# Patient Record
Sex: Female | Born: 1995 | Race: Black or African American | Hispanic: No | Marital: Single | Smoking: Never smoker
Health system: Southern US, Community
[De-identification: ages and names within clinical notes are randomized; demographics above are authoritative.]

## PROBLEM LIST (undated history)

## (undated) DIAGNOSIS — F32A Depression, unspecified: Secondary | ICD-10-CM

## (undated) DIAGNOSIS — I10 Essential (primary) hypertension: Secondary | ICD-10-CM

## (undated) DIAGNOSIS — T8859XA Other complications of anesthesia, initial encounter: Secondary | ICD-10-CM

## (undated) DIAGNOSIS — F419 Anxiety disorder, unspecified: Secondary | ICD-10-CM

## (undated) HISTORY — DX: Depression, unspecified: F32.A

## (undated) HISTORY — DX: Anxiety disorder, unspecified: F41.9

## (undated) HISTORY — DX: Other complications of anesthesia, initial encounter: T88.59XA

## (undated) HISTORY — PX: WISDOM TOOTH EXTRACTION: SHX21

## (undated) HISTORY — DX: Essential (primary) hypertension: I10

---
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
## 2016-06-16 ENCOUNTER — Emergency Department (HOSPITAL_COMMUNITY)
Admission: EM | Admit: 2016-06-16 | Discharge: 2016-06-16 | Disposition: A | Discharge status: Home / Self Care | Payer: BLUE CROSS/BLUE SHIELD | Attending: Emergency Medicine | Admitting: Emergency Medicine

## 2016-06-16 ENCOUNTER — Encounter (HOSPITAL_COMMUNITY): Payer: Self-pay | Admitting: Emergency Medicine

## 2016-06-16 ENCOUNTER — Emergency Department (HOSPITAL_COMMUNITY): Payer: BLUE CROSS/BLUE SHIELD

## 2016-06-16 DIAGNOSIS — R112 Nausea with vomiting, unspecified: Secondary | ICD-10-CM | POA: Diagnosis not present

## 2016-06-16 DIAGNOSIS — R059 Cough, unspecified: Secondary | ICD-10-CM

## 2016-06-16 DIAGNOSIS — Z79899 Other long term (current) drug therapy: Secondary | ICD-10-CM | POA: Insufficient documentation

## 2016-06-16 DIAGNOSIS — B349 Viral infection, unspecified: Secondary | ICD-10-CM

## 2016-06-16 DIAGNOSIS — R05 Cough: Secondary | ICD-10-CM | POA: Diagnosis present

## 2016-06-16 MED ORDER — ACETAMINOPHEN 325 MG PO TABS
650.0000 mg | ORAL_TABLET | Freq: Once | ORAL | Status: AC
  Administered 2016-06-16: 650 mg via ORAL
  Filled 2016-06-16: qty 2

## 2016-06-16 MED ORDER — ONDANSETRON 4 MG PO TBDP: 4.0000 mg | ORAL_TABLET | Freq: Three times a day (TID) | ORAL | 0 refills | Status: DC | PRN

## 2016-06-16 MED ORDER — SODIUM CHLORIDE 0.9 % IV BOLUS (SEPSIS)
1000.0000 mL | Freq: Once | INTRAVENOUS | Status: AC
  Administered 2016-06-16: 1000 mL via INTRAVENOUS

## 2016-06-16 MED ORDER — ONDANSETRON HCL 4 MG/2ML IJ SOLN
4.0000 mg | Freq: Once | INTRAMUSCULAR | Status: AC
  Administered 2016-06-16: 4 mg via INTRAVENOUS
  Filled 2016-06-16: qty 2

## 2016-06-16 NOTE — ED Notes (Signed)
Patient transported to X-ray 

## 2016-06-16 NOTE — ED Provider Notes (Signed)
[facility]-EMERGENCY DEPT Provider Note   CSN: [id number] Arrival date & time: 06/16/16  4098     History   Chief Complaint Chief Complaint  Patient presents with  . Nausea    HPI Sara Jensen is a 21 y.o. female.  The history is provided by the patient and medical records. No language interpreter was used.   Sara Jensen is an otherwise healthy 21 y.o. female who presents to the Emergency Department complaining of nausea, vomiting and productive cough since yesterday. She reports > 10 episodes of emesis. She took Tylenol PM last night with little relief. No alleviating or aggravating factors noted. Patient denies fever, however temp of 102.7 in triage. Denies abdominal pain, dysuria, back pain, vaginal discharge, diarrhea, blood in the stool, chest pain, shortness of breath.    History reviewed. No pertinent past medical history.  There are no active problems to display for this patient.   History reviewed. No pertinent surgical history.  OB History    No data available       Home Medications    Prior to Admission medications   Medication Sig Start Date End Date Taking? Authorizing Provider  diphenhydrAMINE (BENADRYL) 25 mg capsule Take 1 capsule (25 mg total) by mouth every 6 (six) hours as needed for itching or allergies. 02/10/15   Earley Favor, NP  ondansetron (ZOFRAN ODT) 4 MG disintegrating tablet Take 1 tablet (4 mg total) by mouth every 8 (eight) hours as needed for nausea or vomiting. 06/16/16   Chase Picket Marea Reasner, PA-C    Family History No family history on file.  Social History Social History  Substance Use Topics  . Smoking status: Never Smoker  . Smokeless tobacco: Never Used  . Alcohol use No     Allergies   Patient has no known allergies.   Review of Systems Review of Systems  Constitutional: Positive for fever. Negative for chills.  HENT: Positive for congestion.   Eyes: Negative for visual disturbance.  Respiratory: Positive for cough.  Negative for shortness of breath and wheezing.   Cardiovascular: Negative for chest pain.  Gastrointestinal: Positive for nausea and vomiting. Negative for abdominal pain, blood in stool, constipation and diarrhea.  Genitourinary: Negative for dysuria and vaginal discharge.  Musculoskeletal: Negative for back pain and neck pain.  Skin: Negative for rash.  Neurological: Negative for headaches.     Physical Exam Updated Vital Signs BP 116/85   Pulse 103   Temp 102.7 F (39.3 C) (Oral)   Resp 15   Ht 5\' 3"  (1.6 m)   Wt 65.8 kg   LMP 06/11/2016 (Exact Date)   SpO2 100%   BMI 25.69 kg/m   Physical Exam  Constitutional: She is oriented to person, place, and time. She appears well-developed and well-nourished. No distress.  Nontoxic appearing.  HENT:  Head: Normocephalic and atraumatic.  Cardiovascular: Normal heart sounds.   No murmur heard. Tachycardic but regular.  Pulmonary/Chest: Effort normal and breath sounds normal. No respiratory distress. She has no wheezes. She has no rales. She exhibits no tenderness.  Abdominal: Soft. She exhibits no distension. There is no tenderness.  Generalized tenderness to palpation. No focal area of tenderness at McBurney's point. No rebound or guarding.  No CVA tenderness.  Musculoskeletal: Normal range of motion.  Neurological: She is alert and oriented to person, place, and time.  Skin: Skin is warm and dry.  Nursing note and vitals reviewed.    ED Treatments / Results  Labs (all labs ordered are listed,  but only abnormal results are displayed) Labs Reviewed - No data to display  EKG  EKG Interpretation None       Radiology Dg Chest 2 View  Result Date: 06/16/2016 CLINICAL DATA:  Productive cough, fever, multiple episodes of vomiting today. EXAM: CHEST  2 VIEW COMPARISON:  None. FINDINGS: Cardiomediastinal silhouette is normal. No pleural effusions or focal consolidations. Trachea projects midline and there is no pneumothorax.  Soft tissue planes and included osseous structures are non-suspicious. IMPRESSION: Normal chest. Electronically Signed   By: Awilda Metroourtnay  Bloomer M.D.   On: 06/16/2016 19:06    Procedures Procedures (including critical care time)  Medications Ordered in ED Medications  sodium chloride 0.9 % bolus 1,000 mL (1,000 mLs Intravenous New Bag/Given 06/16/16 1911)  ondansetron (ZOFRAN) injection 4 mg (4 mg Intravenous Given 06/16/16 1909)  acetaminophen (TYLENOL) tablet 650 mg (650 mg Oral Given 06/16/16 1937)     Initial Impression / Assessment and Plan / ED Course  I have reviewed the triage vital signs and the nursing notes.  Pertinent labs & imaging results that were available during my care of the patient were reviewed by me and considered in my medical decision making (see chart for details).  Clinical Course    Sara Jensen is a 21 y.o. female who presents to ED for n/v, cough and congestion since yesterday. On exam, patient is febrile 102.7, tachycardic 120's. Abdomen with generalized tenderness but no peritoneal signs or CVA tenderness. CXR negative. Likely viral illness. Fluids, zofran and tylenol given.   Patient reevaluated and feels much improved after fluids/medications. Tolerating PO with no emesis while in ED. PCP follow up encouraged. Return precautions discussed. Symptomatic home care instructions discussed. Rx for zofran given. All questions answered.   Patient discussed with Dr. Clydene PughKnott who agrees with treatment plan.   Final Clinical Impressions(s) / ED Diagnoses   Final diagnoses:  Cough  Viral illness    New Prescriptions New Prescriptions   ONDANSETRON (ZOFRAN ODT) 4 MG DISINTEGRATING TABLET    Take 1 tablet (4 mg total) by mouth every 8 (eight) hours as needed for nausea or vomiting.     [facility] HospitalJaime Pilcher Celso Granja, PA-C 06/16/16 2001    Lyndal Pulleyaniel Knott, MD 06/16/16 2029

## 2016-06-16 NOTE — ED Triage Notes (Signed)
Pt has N/V has thrown up 12 times today, pt started feeling ill yesterday. Denies diarrhea

## 2016-06-16 NOTE — ED Notes (Signed)
Pt verbalized understanding discharge instructions and will follow up with PCP

## 2016-06-16 NOTE — Discharge Instructions (Signed)
Follow up with your primary care doctor about your hospital visit. Continue to hydrate orally with small sips of fluids throughout the day. Use Zofran as directed for nausea & vomiting. Alternate between Tylenol and ibuprofen as needed for fever.   The 'BRAT' diet is suggested, then progress to diet as tolerated as symptoms abate.  Bananas.  Rice.  Applesauce.  Toast (and other simple starches such as crackers, potatoes, noodles).   SEEK IMMEDIATE MEDICAL ATTENTION IF: You begin having localized abdominal pain that does not go away or becomes severe Fever does not improve with tylenol/ibuprofen  Repeated vomiting occurs (multiple uncontrollable episodes) or you are unable to keep fluids down Blood is being passed in stools or vomit (bright red or black tarry stools).  If you develop chest pain, difficulty breathing, dizziness or fainting, or become confused, poorly responsive, or inconsolable (young children).

## 2019-11-13 ENCOUNTER — Ambulatory Visit (INDEPENDENT_AMBULATORY_CARE_PROVIDER_SITE_OTHER): Payer: 59 | Admitting: *Deleted

## 2019-11-13 ENCOUNTER — Other Ambulatory Visit: Payer: Self-pay

## 2019-11-13 ENCOUNTER — Encounter: Payer: Self-pay | Admitting: General Practice

## 2019-11-13 VITALS — BP 136/82 | HR 89 | Temp 98.3°F | Ht 63.0 in | Wt 136.8 lb

## 2019-11-13 DIAGNOSIS — Z3200 Encounter for pregnancy test, result unknown: Secondary | ICD-10-CM

## 2019-11-13 DIAGNOSIS — Z3201 Encounter for pregnancy test, result positive: Secondary | ICD-10-CM

## 2019-11-13 LAB — POCT URINE PREGNANCY: Preg Test, Ur: POSITIVE — AB

## 2019-11-13 NOTE — Progress Notes (Signed)
   Ms. Yearick presents today for UPT.  She complains of nausea for 2 weeks with vomiting for 1 day.  LMP: 09/23/2019    OBJECTIVE: Appears well, in no apparent distress.  OB History    Gravida  1   Para      Term      Preterm      AB      Living        SAB      TAB      Ectopic      Multiple      Live Births             Home UPT Result: Positive In-Office UPT result: Positive   I have reviewed the patient's medical, obstetrical, social, and family histories, and medications.   ASSESSMENT: Positive pregnancy test  PLAN Prenatal care to be completed at: [facility] Continue PNV Declined medication for nausea.  Clovis Pu, RN

## 2019-11-13 NOTE — Patient Instructions (Addendum)
First Trimester of Pregnancy  The first trimester of pregnancy is from week 1 until the end of week 13 (months 1 through 3). During this time, your baby will begin to develop inside you. At 6-8 weeks, the eyes and face are formed, and the heartbeat can be seen on ultrasound. At the end of 12 weeks, all the baby's organs are formed. Prenatal care is all the medical care you receive before the birth of your baby. Make sure you get good prenatal care and follow all of your doctor's instructions. Follow these instructions at home: Medicines  Take over-the-counter and prescription medicines only as told by your doctor. Some medicines are safe and some medicines are not safe during pregnancy.  Take a prenatal vitamin that contains at least 600 micrograms (mcg) of folic acid.  If you have trouble pooping (constipation), take medicine that will make your stool soft (stool softener) if your doctor approves. Eating and drinking   Eat regular, healthy meals.  Your doctor will tell you the amount of weight gain that is right for you.  Avoid raw meat and uncooked cheese.  If you feel sick to your stomach (nauseous) or throw up (vomit): ? Eat 4 or 5 small meals a day instead of 3 large meals. ? Try eating a few soda crackers. ? Drink liquids between meals instead of during meals.  To prevent constipation: ? Eat foods that are high in fiber, like fresh fruits and vegetables, whole grains, and beans. ? Drink enough fluids to keep your pee (urine) clear or pale yellow. Activity  Exercise only as told by your doctor. Stop exercising if you have cramps or pain in your lower belly (abdomen) or low back.  Do not exercise if it is too hot, too humid, or if you are in a place of great height (high altitude).  Try to avoid standing for long periods of time. Move your legs often if you must stand in one place for a long time.  Avoid heavy lifting.  Wear low-heeled shoes. Sit and stand up  straight.  You can have sex unless your doctor tells you not to. Relieving pain and discomfort  Wear a good support bra if your breasts are sore.  Take warm water baths (sitz baths) to soothe pain or discomfort caused by hemorrhoids. Use hemorrhoid cream if your doctor says it is okay.  Rest with your legs raised if you have leg cramps or low back pain.  If you have puffy, bulging veins (varicose veins) in your legs: ? Wear support hose or compression stockings as told by your doctor. ? Raise (elevate) your feet for 15 minutes, 3-4 times a day. ? Limit salt in your food. Prenatal care  Schedule your prenatal visits by the twelfth week of pregnancy.  Write down your questions. Take them to your prenatal visits.  Keep all your prenatal visits as told by your doctor. This is important. Safety  Wear your seat belt at all times when driving.  Make a list of emergency phone numbers. The list should include numbers for family, friends, the hospital, and police and fire departments. General instructions  Ask your doctor for a referral to a local prenatal class. Begin classes no later than at the start of month 6 of your pregnancy.  Ask for help if you need counseling or if you need help with nutrition. Your doctor can give you advice or tell you where to go for help.  Do not use hot tubs, steam   tubs, steam rooms, or saunas.  Do not douche or use tampons or scented sanitary pads.  Do not cross your legs for long periods of time.  Avoid all herbs and alcohol. Avoid drugs that are not approved by your doctor.  Do not use any tobacco products, including cigarettes, chewing tobacco, and electronic cigarettes. If you need help quitting, ask your doctor. You may get counseling or other support to help you quit.  Avoid cat litter boxes and soil used by cats. These carry germs that can cause birth defects in the baby and can cause a loss of your baby (miscarriage) or stillbirth.  Visit your dentist.  At home, brush your teeth with a soft toothbrush. Be gentle when you floss. Contact a doctor if:  You are dizzy.  You have mild cramps or pressure in your lower belly.  You have a nagging pain in your belly area.  You continue to feel sick to your stomach, you throw up, or you have watery poop (diarrhea).  You have a bad smelling fluid coming from your vagina.  You have pain when you pee (urinate).  You have increased puffiness (swelling) in your face, hands, legs, or ankles. Get help right away if:  You have a fever.  You are leaking fluid from your vagina.  You have spotting or bleeding from your vagina.  You have very bad belly cramping or pain.  You gain or lose weight rapidly.  You throw up blood. It may look like coffee grounds.  You are around people who have [place] measles, fifth disease, or chickenpox.  You have a very bad headache.  You have shortness of breath.  You have any kind of trauma, such as from a fall or a car accident. Summary  The first trimester of pregnancy is from week 1 until the end of week 13 (months 1 through 3).  To take care of yourself and your unborn baby, you will need to eat healthy meals, take medicines only if your doctor tells you to do so, and do activities that are safe for you and your baby.  Keep all follow-up visits as told by your doctor. This is important as your doctor will have to ensure that your baby is healthy and growing well. This information is not intended to replace advice given to you by your health care provider. Make sure you discuss any questions you have with your health care provider. Document Revised: 09/07/2018 Document Reviewed: 05/25/2016 Elsevier Patient Education  2020 [employer].  Warning Signs During Pregnancy A pregnancy lasts about 40 weeks, starting from the first day of your last period until the baby is born. Pregnancy is divided into three phases called trimesters.  The first trimester  refers to week 1 through week 13 of pregnancy.  The second trimester is the start of week 14 through the end of week 27.  The third trimester is the start of week 28 until you deliver your baby. During each trimester of pregnancy, certain signs and symptoms may indicate a problem. Talk with your health care provider about your current health and any medical conditions you have. Make sure you know the symptoms that you should watch for and report. How does this affect me?  Warning signs in the first trimester While some changes during the first trimester may be uncomfortable, most do not represent a serious problem. Let your health care provider know if you have any of the following warning signs in the first trimester:  You cannot eat or drink without vomiting, and this lasts for longer than a day.  You have vaginal bleeding or spotting along with menstrual-like cramping.  You have diarrhea for longer than a day.  You have a fever or other signs of infection, such as: ? Pain or burning when you urinate. ? Foul smelling or thick or yellowish vaginal discharge. Warning signs in the second trimester As your baby grows and changes during the second trimester, there are additional signs and symptoms that may indicate a problem. These include:  Signs and symptoms of infection, including a fever.  Signs or symptoms of a miscarriage or preterm labor, such as regular contractions, menstrual-like cramping, or lower abdominal pain.  Bloody or watery vaginal discharge or obvious vaginal bleeding.  Feeling like your heart is pounding.  Having trouble breathing.  Nausea, vomiting, or diarrhea that lasts for longer than a day.  Craving non-food items, such as clay, chalk, or dirt. This may be a sign of a very treatable medical condition called pica. Later in your second trimester, watch for signs and symptoms of a serious medical condition called preeclampsia.These include:  Changes in your  vision.  A severe headache that does not go away.  Nausea and vomiting. It is also important to notice if your baby stops moving or moves less than usual during this time. Warning signs in the third trimester As you approach the third trimester, your baby is growing and your body is preparing for the birth of your baby. In your third trimester, be sure to let your health care provider know if:  You have signs and symptoms of infection, including a fever.  You have vaginal bleeding.  You notice that your baby is moving less than usual or is not moving.  You have nausea, vomiting, or diarrhea that lasts for longer than a day.  You have a severe headache that does not go away.  You have vision changes, including seeing spots or having blurry or double vision.  You have increased swelling in your hands or face. How does this affect my baby? Throughout your pregnancy, always report any of the warning signs of a problem to your health care provider. This can help prevent complications that may affect your baby, including:  Increased risk for premature birth.  Infection that may be transmitted to your baby.  Increased risk for stillbirth. Contact a health care provider if:  You have any of the warning signs of a problem for the current trimester of your pregnancy.  Any of the following apply to you during any trimester of pregnancy: ? You have strong emotions, such as sadness or anxiety, that interfere with work or personal relationships. ? You feel unsafe in your home and need help finding a safe place to live. ? You are using tobacco products, alcohol, or drugs and you need help to stop. Get help right away if: You have signs or symptoms of labor before 37 weeks of pregnancy. These include:  Contractions that are 5 minutes or less apart, or that increase in frequency, intensity, or length.  Sudden, sharp abdominal pain or low back pain.  Uncontrolled gush or trickle of fluid  from your vagina. Summary  A pregnancy lasts about 40 weeks, starting from the first day of your last period until the baby is born. Pregnancy is divided into three phases called trimesters. Each trimester has warning signs to watch for.  Always report any warning signs to your health care provider in  order to prevent complications that may affect both you and your baby.  Talk with your health care provider about your current health and any medical conditions you have. Make sure you know the symptoms that you should watch for and report. This information is not intended to replace advice given to you by your health care provider. Make sure you discuss any questions you have with your health care provider. Document Revised: 09/05/2018 Document Reviewed: 03/03/2017 Elsevier Patient Education  2020 [employer]  

## 2019-11-28 ENCOUNTER — Telehealth (INDEPENDENT_AMBULATORY_CARE_PROVIDER_SITE_OTHER): Payer: 59 | Admitting: *Deleted

## 2019-11-28 ENCOUNTER — Other Ambulatory Visit: Payer: Self-pay

## 2019-11-28 DIAGNOSIS — Z3490 Encounter for supervision of normal pregnancy, unspecified, unspecified trimester: Secondary | ICD-10-CM

## 2019-11-28 DIAGNOSIS — F419 Anxiety disorder, unspecified: Secondary | ICD-10-CM

## 2019-11-28 DIAGNOSIS — F32A Depression, unspecified: Secondary | ICD-10-CM

## 2019-11-28 DIAGNOSIS — I10 Essential (primary) hypertension: Secondary | ICD-10-CM | POA: Insufficient documentation

## 2019-11-28 DIAGNOSIS — F329 Major depressive disorder, single episode, unspecified: Secondary | ICD-10-CM

## 2019-11-28 NOTE — Progress Notes (Signed)
2:12pm I called Taje for her new ob intake visit and asked if she can connect with me virtually . She confirmed she would. We  Disconnected  And then reconnected virtually.  I connected with  [name] on 11/28/19 at  2:15 PM EDT by virtually and verified that I am speaking with the correct person using two identifiers.   I discussed the limitations, risks, security and privacy concerns of performing an evaluation and management service by virtually and the availability of in person appointments. I also discussed with the patient that there may be a patient responsible charge related to this service. The patient expressed understanding and agreed to proceed  I explained I am completing her New OB Intake today. We discussed Her EDD and that  We can not confirm her EDD. She reports she is not sure if her LMP was 08/28/19 or 09/23/19. States she has a period app and the last one she documented is 08/28/19 and is not sure if she has a period on 09/23/19.  I explained our protocol is if you are unsure of LMP to do a dating [place]. She agreed with plan and I scheduled a dating [place] for 12/05/19.   I reviewed her allergies, meds, OB History, Medical /Surgical history, and appropriate screenings. I informed her of [facility].  She is a G1P0 . 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 States she has been watching her diet . States at [facility] her blood pressure was normal.   I explained  We will watch her blood pressure closely during her pregnancy and we will have her check her blood pressure weekly at home if possible.  I explained I will send her the [employer] app so that she can log in her blood pressures and for all the wonderful information in the app. App was sent to her while on phone.   I explained her insurance does not cover blood pressure cuff prescription. I asked if she can buy one and she confirmed she can .  I asked her to bring the  blood pressure cuff with her to her first ob appointment so we can show her how to use it. Explained  then we will have her take her blood pressure weekly and enter into the app.  I explained she will have some visits in office and some virtually. She already has MyChart  She used on the computer and will download the app.  I reviewed her new ob  appointment date/ time with her , our location and to wear [employer].   I explained she will have a pelvic exam, ob bloodwork, hemoglobin a1C, cbg ,pap, and  genetic testing if desired,- she does want a panorama.  I explained we will schedule an [place] at 19 weeks  Once we know her actual EDD.  She voices understanding.  Legrand Como 11/28/2019  2:15 PM

## 2019-11-28 NOTE — Patient Instructions (Signed)

## 2019-11-29 NOTE — Progress Notes (Signed)
Chart reviewed for nurse visit. Agree with plan of care. Plan to schedule [place] for dating prior to new OB visit.   Marny Lowenstein, PA-C 11/29/2019 1:30 PM

## 2019-12-05 ENCOUNTER — Ambulatory Visit
Admission: RE | Admit: 2019-12-05 | Discharge: 2019-12-05 | Disposition: A | Discharge status: Home / Self Care | Payer: 59 | Source: Ambulatory Visit | Attending: Medical | Admitting: Medical

## 2019-12-05 ENCOUNTER — Other Ambulatory Visit: Payer: Self-pay

## 2019-12-05 DIAGNOSIS — Z3490 Encounter for supervision of normal pregnancy, unspecified, unspecified trimester: Secondary | ICD-10-CM | POA: Insufficient documentation

## 2019-12-10 ENCOUNTER — Encounter: Payer: Self-pay | Admitting: Medical

## 2019-12-10 ENCOUNTER — Other Ambulatory Visit: Payer: Self-pay

## 2019-12-10 ENCOUNTER — Other Ambulatory Visit (HOSPITAL_COMMUNITY)
Admission: RE | Admit: 2019-12-10 | Discharge: 2019-12-10 | Disposition: A | Discharge status: Home / Self Care | Payer: 59 | Source: Ambulatory Visit | Attending: Medical | Admitting: Medical

## 2019-12-10 ENCOUNTER — Ambulatory Visit (INDEPENDENT_AMBULATORY_CARE_PROVIDER_SITE_OTHER): Payer: 59 | Admitting: Medical

## 2019-12-10 VITALS — BP 127/82 | HR 86 | Wt 141.7 lb

## 2019-12-10 DIAGNOSIS — Z3491 Encounter for supervision of normal pregnancy, unspecified, first trimester: Secondary | ICD-10-CM | POA: Insufficient documentation

## 2019-12-10 DIAGNOSIS — Z3A19 19 weeks gestation of pregnancy: Secondary | ICD-10-CM

## 2019-12-10 DIAGNOSIS — Z3492 Encounter for supervision of normal pregnancy, unspecified, second trimester: Secondary | ICD-10-CM

## 2019-12-10 DIAGNOSIS — Z3689 Encounter for other specified antenatal screening: Secondary | ICD-10-CM

## 2019-12-10 DIAGNOSIS — O99342 Other mental disorders complicating pregnancy, second trimester: Secondary | ICD-10-CM | POA: Diagnosis not present

## 2019-12-10 DIAGNOSIS — F419 Anxiety disorder, unspecified: Secondary | ICD-10-CM | POA: Diagnosis not present

## 2019-12-10 DIAGNOSIS — F329 Major depressive disorder, single episode, unspecified: Secondary | ICD-10-CM | POA: Diagnosis not present

## 2019-12-10 LAB — POCT URINALYSIS DIP (DEVICE)
Bilirubin Urine: NEGATIVE
Glucose, UA: NEGATIVE mg/dL
Hgb urine dipstick: NEGATIVE
Ketones, ur: NEGATIVE mg/dL
Leukocytes,Ua: NEGATIVE
Nitrite: NEGATIVE
Protein, ur: NEGATIVE mg/dL
Specific Gravity, Urine: 1.025 (ref 1.005–1.030)
Urobilinogen, UA: 0.2 mg/dL (ref 0.0–1.0)
pH: 7 (ref 5.0–8.0)

## 2019-12-10 NOTE — Patient Instructions (Addendum)
Safe Medications in Pregnancy   Acne:  Benzoyl Peroxide  Salicylic Acid   Backache/Headache:  Tylenol: 2 regular strength every 4 hours OR        2 Extra strength every 6 hours   Colds/Coughs/Allergies:  Benadryl (alcohol free) 25 mg every 6 hours as needed  Breath right strips  Claritin  Cepacol throat lozenges  Chloraseptic throat spray  Cold-Eeze- up to three times per day  Cough drops, alcohol free  Flonase (by prescription only)  Guaifenesin  Mucinex  Robitussin DM (plain only, alcohol free)  Saline nasal spray/drops  Sudafed (pseudoephedrine) & Actifed * use only after [redacted] weeks gestation and if you do not have high blood pressure  Tylenol  Vicks Vaporub  Zinc lozenges  Zyrtec   Constipation:  Colace  Ducolax suppositories  Fleet enema  Glycerin suppositories  Metamucil  Milk of magnesia  Miralax  Senokot  Smooth move tea   Diarrhea:  Kaopectate  Imodium A-D   *NO pepto Bismol   Hemorrhoids:  Anusol  Anusol HC  Preparation H  Tucks   Indigestion:  Tums  Maalox  Mylanta  Zantac  Pepcid   Insomnia:  Benadryl (alcohol free) 25mg  every 6 hours as needed  Tylenol PM  Unisom, no Gelcaps   Leg Cramps:  Tums  MagGel   Nausea/Vomiting:  Bonine  Dramamine  Emetrol  Ginger extract  Sea bands  Meclizine  Nausea medication to take during pregnancy:  Unisom (doxylamine succinate 25 mg tablets) Take one tablet daily at bedtime. If symptoms are not adequately controlled, the dose can be increased to a maximum recommended dose of two tablets daily (1/2 tablet in the morning, 1/2 tablet mid-afternoon and one at bedtime).  Vitamin B6 100mg  tablets. Take one tablet twice a day (up to 200 mg per day).   Skin Rashes:  Aveeno products  Benadryl cream or 25mg  every 6 hours as needed  Calamine Lotion  1% cortisone cream   Yeast infection:  Gyne-lotrimin 7  Monistat 7    **If taking multiple medications, please check labels to avoid  duplicating the same active ingredients  **take medication as directed on the label  ** Do not exceed 4000 mg of tylenol in 24 hours  **Do not take medications that contain aspirin or ibuprofen         AREA PEDIATRIC/FAMILY PRACTICE PHYSICIANS  [place]outheast [place] ( ) . [facility] [place]; [id number], MD; [facility], MD; Melodie Bouillon, MD; McDiarmid, MD; Lum Babe, MD; Sheffield Slider, MD; Leveda Anna, MD o [address] [place], [address] [place] o [phone] o Mon-Fri 8:30-12:30, 1:30-5:00 o Providers come to see babies at [facility] o Accepting Medicaid . [facility] [place] o Limited providers who accept newborns: [phone], MD; [facility], MD; [place], MD o [address], [place], Paulino Rily [address] o [phone] o Mon-Fri 8:00-5:30 o Babies seen by providers at [facility] o Does NOT accept Medicaid o Please call early in hospitalization for appointment (limited availability)  . [facility] [phone], MD o [address], [place], Fatima Sanger [address] o [phone] o Mon, Tue, Thur, Fri 8:30-5:00, Wed 10:00-7:00 (closed 1-2pm) o Babies seen by [facility] providers o Accepting Medicaid . 12-29-1967 - Pediatrician 05-18-1997, MD o [address], [place], Fae Pippin [place] o [phone] o Mon-Fri 8:30-5:00, Sat 8:30-12:00 o Provider comes to see babies at [facility] o Accepting Medicaid o Must have been referred from current patients or contacted office  prior to delivery . [facility]) Leotis Pain, MD; Ave Filter, MD; Luna Fuse, MD; Kennedy Bucker, MD; Konrad Dolores, MD; Kathlene November, MD; Jenne Campus, MD; Lubertha South, MD; Wynetta Emery, MD; Duffy Rhody, MD; Gerre Couch, NP; Shirl Harris, NP o [address] [place], [place], [place] [address] o [phone] o Mon, Tue, Thur, Fri 8:30-5:30, Wed 9:30-5:30, Sat 8:30-12:30 o Babies seen by [facility]  providers o Accepting Medicaid o Only accepting infants of first-time parents or siblings of current patients [facility] discharge coordinator will make follow-up appointment . Cyril Mourning o [address] [place], [place]  [address] o [phone]   Fax - [phone] . [facility] o [address], [place], [place]  [address] o Phone - [phone]   Fax - [phone] . Lucio Edward o [address], [place], [place]  [address] o [phone]  [place] [phone]) . [facility] Jorge Mandril, MD; Alita Chyle, MD; Princella Ion, MD; MD; Earlene Plater, MD; Jamesetta Orleans, MD; Alvera Novel, MD; Clarene Duke, MD; Rana Snare, MD; Carmon Ginsberg, MD; Alinda Money, MD; Hosie Poisson, MD; Mayford Knife, MD o [address] [place], [place] [address] o [phone] o Mon-Fri 8:30-5:00 (extended evenings Mon-Thur as needed), Sat-Sun 10:00-1:00 o Providers come to see babies at [facility] o Accepting Medicaid for families of first-time babies and families with all children in the household age 47 and under. Must register with office prior to making appointment (M-F only). [facility] Odella Aquas, NP; Lynelle Doctor, MD; Susann Givens, MD; [place], [place] o [address], [place], [place] [address] o [phone] o Mon-Fri 8:00-5:00 o Babies seen by providers at [facility] o Does NOT accept Medicaid/Commercial Insurance Only . [facility] [place] ([facility])  Suzette Battiest, MD; Zachery Dauer, MD; Stefan Church, MD; Sabino Dick, MD; Quitman Livings, MD; Farris Has, MD; Gaynell Face, MD; Betha Loa, MD; Colon Flattery, MD; Clifton James, MD o [address] [place] [place], [place] [address] o [phone] o Mon-Fri 8:30-5:30, Sat (Oct.-Mar.) 9:00-1:00 o Babies seen by providers at [facility] o Accepting [facility] [phone]) . [facility] D[facility]ri, MD; Sheliah Hatch, MD o [address], [place], [place] [address] o [phone] o Mon-Fri 8:30-5:00, Sat  8:30-12:00 o Providers come to see babies at [facility] o Does NOT accept Medicaid . [facility] Cindy Hazy, [place]; Frannie, MD; [place], [place]; Wynelle Link, MD; Azucena Cecil, MD o [address], [place], [place] [address] o [phone] o Mon-Fri 8:00-5:00 o Babies seen by providers at [facility] o Does NOT accept Medicaid o Only accepting babies of parents who are patients o Please call early in hospitalization for appointment (limited availability) . [facility] Lamar Benes, MD; Abran Cantor, MD; Early Osmond, MD; Cherre Huger, NP; Hyacinth Meeker, MD; Dwan Bolt, MD; Jarold Motto, NP; Dario Guardian, MD; Talmage Nap, MD; Maisie Fus, MD; Pricilla Holm, MD; Tama High, MD o [address] [place], [place], [place] [address] o [phone] o Mon-Fri 8:00-5:00, Sat 9:00-12:00 o Providers come to see babies at [facility] o Does NOT accept [facility] [phone]) . [facility] o Limited providers accepting new patients: Drema Pry, NP; [place], [place] o [address], [place], [place] [address] o [phone] o Mon-Fri 8:00-5:00 o Babies seen by providers at [facility] o Does NOT accept Medicaid o Only accepting babies of parents who are patients o Please call early in hospitalization for appointment (limited availability) . [facility] Luan Pulling, MD; Nash Dimmer, MD o [address] [place] [address] [place], [place] [address]  o [phone] (press 1 to schedule appointment) o Mon-Fri 8:00-5:00 o Providers come to see babies at [facility] o Does NOT accept Medicaid . [facility] Cristino Martes, MD o [address], [place], [place] [address] o [phone] o Mon-Fri 8:30-5:00 (lunch 12:30-1:00), extended hours by appointment only Wed 5:00-6:30 o Babies seen by [facility] providers o Accepting Medicaid . [facility] Abbot, MD; [place], MD; Hassan Rowan, MD o [address] [place], [place], [place] [address] o [phone] o Mon-Fri  8:00-5:00 o Babies seen by [facility] providers o Does NOT accept Medicaid . Nature conservation officer at [place] [address] Elsworth Soho, MD; Durene Cal, MD; [place], DO o [address] [place], [place] [address] o [phone] o Mon-Fri 8:00-5:00 o Babies seen by [facility] providers o Does NOT accept Medicaid . [facility] o [place], [place]; [place], [place]; [place], NP; Avis Epley, MD; Vonna Kotyk, MD; Clance Boll, MD; Stevphen Rochester, NP; Arvilla Market, NP; Ann Maki, NP; Otis Dials, NP; Vaughan Basta, MD; [place], MD o [address] [place], [place] [address] o [phone] o Mon-Fri 8:30-5:00, Sat 10:00-1:00 o Providers come to see babies at [facility] o Does NOT accept Medicaid o Free prenatal information session Tuesdays at 4:45pm . [facility] Luna Kitchens, MD; [place], [place]; [place], [place]; [place], [place] o [address] [place] [address] o [phone] o Mon-Fri 7:30-5:30 o Babies seen by [facility] providers . [facility] o [address], [place], [place]  [address] o [phone]   Fax - [phone]  [place] [phone] & [phone]) . [facility] Alphonsa Overall, MD o [address] [place], [place] [address] o [phone] o Mon-Thur 8:00-6:00 o Providers come to see babies at [facility] o Accepting Medicaid . [facility] Zenon Mayo, NP; Cyndia Bent, MD; [place], [place]; [place], [place] o [address] [place], [place] [address] o [phone] o Mon-Thur 7:30-7:30, Fri 7:30-4:30 o Babies seen by [facility] providers o Accepting Medicaid . [facility] Cheryle Horsfall, MD; Janene Harvey, NP; Vonita Moss, MD o [address], [place], [place] [address] o [phone] o Mon-Fri 8:30-5:00, Sat 8:30-12:00 o Providers come to see babies at [facility] o Accepting Medicaid o Must have "Meet & Greet" appointment at office prior to delivery . [facility] [place] ([facility]) Llana Aliment, MD; Earlene Plater, MD; Lucretia Roers, MD o [address], [place], [place] [address] o [phone] o Mon-Wed 8:00-6:00, Thur-Fri 8:00-5:00, Sat 9:00-12:00 o Providers come to see babies at [facility] o Does NOT accept Medicaid o Only accepting siblings of current patients . [facility]  o [address], [place], [place]  [address] o [phone]   Fax - [phone] . [facility] at [facility] o [phone] [address], [place], [place]  [address] o [phone]   Fax - [phone]  [place] [phone] & [phone]) . Nature conservation officer at [facility] o [place], DO; [place], DO o [address] [place], [place] [address] o [phone] o Mon-Fri 7:00-5:00 o Babies seen by [facility] providers o Does NOT accept Medicaid . [facility] Ellis Savage, MD; [place], [place]; [employer], [place] o [address], [place], [place] [address] o [phone] o Mon-Fri 8:00-5:00 o Babies seen by [facility] providers o Accepting Medicaid . [facility] - [address] Franne Forts, MD; [place], [place]; Micco, NP; [place], [place] o [address] [place], [place], [place] [address]  o [phone] o Mon-Fri 8:00-5:00 o Babies seen by providers at [facility] o Accepting [facility] [phone]) . [facility] [place], [place] o [address] [place], [place] [address] o [phone] o Mon-Fri 8:00-5:00 o Babies seen by [facility] providers o Does NOT accept Medicaid o Limited availability, please call early in hospitalization to schedule follow-up . [facility] Jolee Ewing, PA; Eddie Candle, MD; [place], MD; [place], [place]; Constance Goltz, MD; [place], [place] o [facility] [address], [place], [place] [address] o [phone] o Mon-Fri 8:30-5:00, Sat 9:00-12:00 o Babies seen by providers at  [facility] o Accepting Medicaid o Please register online then schedule online or call office o [email or web address] . [facility]) Samuella Bruin, NP; Lucianne Muss, MD; Lanier Clam, PA o [address], [place], [place] [address] o [phone] o Mon-Fri 8:00-5:00 o Babies seen by providers at [facility] o Accepting Medicaid . [facility] [place] ([facility]) Sharin Mons, MD; Reed Breech, NP; Shelva Majestic, MD o [address], [place], [place] [address] o [phone] o Mon-Fri 8:00-5:30, Sat&Sun by appointment (phones open at 8:30) o Babies seen by [facility] providers o Accepting Medicaid o Must be a first-time baby or sibling of current patient . [facility] [address], [place], [place]  [address] o [phone]   Fax - [phone]  [place] [phone] & [phone]) . [facility] Medicine o [place], [place]; [place], [place]; Dimple Casey, MD; [place], [place]; Carolyne Fiscal, MD o [address] [place], [place] [address] o [phone] o Mon-Thur 8:00-7:00, Fri 8:00-5:00, Sat 8:00-12:00, Sun 9:00-12:00 o Babies seen by [facility] providers o Accepting Medicaid . [facility] at [facility], MD; Gaynell Face, MD; [facility], MD o [address], [place], [place] [address] o [phone] o Mon-Thur 8:00-5:00 o Babies seen by providers at [facility] o Accepting Medicaid . [facility] [place] Gwenlyn Saran, MD; Coe[place]Go[place], MD; Madilyn Fireman, MD; Melvyn Neth, MD; List, MD; Lazarus Salines, MD; Gaynell Face, MD; Berneda Rose, MD; Flora Lipps, MD; Beryl Meager, MD; Luther Redo, MD; Lavonia Drafts, MD; Kellie Simmering, MD o [address] [place] [place], [place] [address] o [phone] o Mon-Fri 8:00-5:30, Sat (Oct.-Mar.) 9:00-1:00 o Babies seen by providers at [facility] o Accepting Medicaid o Must fill out  new patient packet, available online at [email or web address] . [facility] [place] [facility] at [facility]) Simone Curia, NP; Tiburcio Pea, NP; Tresa Endo, NP; Whitney Post, MD; [place], [place]; Hennie Duos, MD; Wynne Dust, MD; Kavin Leech, NP o [address], [place], [place] [address] o [phone] o Mon-Thur 8:00-5:30, Fri 8:00-5:00 o Babies seen by providers at [facility] o Accepting [facility] [phone]) . [facility] o [place], [place]; [place], MD; Tanya Nones, MD; [place], [place] o [address] [place], [place] [address] o [phone] o Mon-Fri 8:00-5:00 o Babies seen by providers at [facility] o Accepting [facility] [phone]) . [facility] at [facility] o [place], DO; Lenise Arena, MD; [place], [place] o [address], [place], [place] [address] o [phone] o Mon-Fri 8:00-5:00 o Babies seen by providers at [facility] o Does NOT accept Medicaid o Limited appointment availability, please call early in hospitalization  . Nature conservation officer at [facility] o [place], [place];  [place], MD o [address], [place], [place] [address] o [phone] o Mon-Fri 8:00-5:00 o Babies seen by [facility] providers o Does NOT accept Medicaid . [facility] Lorrine Kino Cameron, MD; Ninetta LightsMacDonald, MD; [place], [place]; Redbird SmithNayak, MD o [address], [place], [place] [address] o [phone] o Mon-Fri 8:00-5:00 o After hours clinic [facility]([address] [place], [place] [address]) [phone] Mon-Fri 5:00-8:00, Sat 12:00-6:00, Sun 10:00-4:00 o Babies seen by [facility] providers o Accepting Medicaid . [facility] o [address], [place], [place]  [address] o [phone]   Fax - [phone]  [place] [phone]) . [facility] o Andy, MD o [address] [place], [place], [place] [address] o [phone] o Mon-Fri 8:00-5:00 o Babies seen by [facility] providers o Does NOT accept Medicaid . [facility] [place] [facility] Tomi Likenso Eksir, MD o [address], [place], [place] [address] o [phone] o Mon-Thur 8:00-7:00, Fri 8:00-5:00, Sat 8:00-12:00 o Babies seen by providers at [facility] o Accepting Medicaid - but does not have vaccinations in office (must be received elsewhere) o Limited availability, please call early in hospitalization  [place] ([phone]) . [facility]  o Wyvonne Lenzharlene Flemming, MD o [address], [place] [address] o [phone]  Fax [phone]   First Trimester of Pregnancy  The first trimester of pregnancy is from week 1 until the end of week 13 (months 1 through 3). During this time, your baby will begin to develop inside you. At 6-8 weeks, the eyes and face are formed, and the heartbeat can be seen on ultrasound. At the end of 12 weeks, all the baby's organs are formed. Prenatal care is all the medical care you receive before the birth of your baby. Make sure you get good prenatal care and follow all of your doctor's instructions. Follow these instructions at home: Medicines  Take over-the-counter and prescription medicines only as told by your doctor. Some medicines are safe and some medicines are not safe during pregnancy.  Take a prenatal vitamin that contains at least 600 micrograms (mcg) of folic acid.  If you have trouble pooping (constipation), take medicine that will make your stool soft (stool softener) if your doctor approves. Eating and drinking   Eat regular, healthy meals.  Your doctor will tell you the amount of weight gain that is right for you.  Avoid raw meat and uncooked cheese.  If you feel sick to your stomach (nauseous) or throw up (vomit): ? Eat 4 or 5 small meals a day instead of 3 large meals. ? Try eating a few soda crackers. ? Drink liquids between meals instead of during meals.  To prevent  constipation: ? Eat foods that are high in fiber, like fresh fruits and vegetables, whole grains, and beans. ? Drink enough fluids to keep your pee (urine) clear or pale yellow. Activity  Exercise only as told by your doctor. Stop exercising if you have cramps or pain in your lower belly (abdomen) or low back.  Do not exercise if it is too hot, too humid, or if you are in a place of great height (high altitude).  Try to avoid standing for long periods of time. Move your legs often if you must stand in one place for a long time.  Avoid heavy lifting.  Wear low-heeled shoes. Sit and stand up straight.  You can have sex  unless your doctor tells you not to. Relieving pain and discomfort  Wear a good support bra if your breasts are sore.  Take warm water baths (sitz baths) to soothe pain or discomfort caused by hemorrhoids. Use hemorrhoid cream if your doctor says it is okay.  Rest with your legs raised if you have leg cramps or low back pain.  If you have puffy, bulging veins (varicose veins) in your legs: ? Wear support hose or compression stockings as told by your doctor. ? Raise (elevate) your feet for 15 minutes, 3-4 times a day. ? Limit salt in your food. Prenatal care  Schedule your prenatal visits by the twelfth week of pregnancy.  Write down your questions. Take them to your prenatal visits.  Keep all your prenatal visits as told by your doctor. This is important. Safety  Wear your seat belt at all times when driving.  Make a list of emergency phone numbers. The list should include numbers for family, friends, the hospital, and police and fire departments. General instructions  Ask your doctor for a referral to a local prenatal class. Begin classes no later than at the start of month 6 of your pregnancy.  Ask for help if you need counseling or if you need help with nutrition. Your doctor can give you advice or tell you where to go for help.  Do not use hot tubs,  steam rooms, or saunas.  Do not douche or use tampons or scented sanitary pads.  Do not cross your legs for long periods of time.  Avoid all herbs and alcohol. Avoid drugs that are not approved by your doctor.  Do not use any tobacco products, including cigarettes, chewing tobacco, and electronic cigarettes. If you need help quitting, ask your doctor. You may get counseling or other support to help you quit.  Avoid cat litter boxes and soil used by cats. These carry germs that can cause birth defects in the baby and can cause a loss of your baby (miscarriage) or stillbirth.  Visit your dentist. At home, brush your teeth with a soft toothbrush. Be gentle when you floss. Contact a doctor if:  You are dizzy.  You have mild cramps or pressure in your lower belly.  You have a nagging pain in your belly area.  You continue to feel sick to your stomach, you throw up, or you have watery poop (diarrhea).  You have a bad smelling fluid coming from your vagina.  You have pain when you pee (urinate).  You have increased puffiness (swelling) in your face, hands, legs, or ankles. Get help right away if:  You have a fever.  You are leaking fluid from your vagina.  You have spotting or bleeding from your vagina.  You have very bad belly cramping or pain.  You gain or lose weight rapidly.  You throw up blood. It may look like coffee grounds.  You are around people who have [place] measles, fifth disease, or chickenpox.  You have a very bad headache.  You have shortness of breath.  You have any kind of trauma, such as from a fall or a car accident. Summary  The first trimester of pregnancy is from week 1 until the end of week 13 (months 1 through 3).  To take care of yourself and your unborn baby, you will need to eat healthy meals, take medicines only if your doctor tells you to do so, and do activities that are safe for you and your baby.  Keep all follow-up visits as told by  your doctor. This is important as your doctor will have to ensure that your baby is healthy and growing well. This information is not intended to replace advice given to you by your health care provider. Make sure you discuss any questions you have with your health care provider. Document Revised: 09/07/2018 Document Reviewed: 05/25/2016 Elsevier Patient Education  2020 [employer].  Childbirth Education Options: [facility] Department Classes:  Childbirth education classes can help you get ready for a positive parenting experience. You can also meet other expectant parents and get free stuff for your baby. Each class runs for five weeks on the same night and costs $45 for the mother-to-be and her support person. Medicaid covers the cost if you are eligible. Call [phone] to register. [facility] Childbirth Education:  [phone] or [phone] or [email or web address]  Baby & Me Class: Discuss newborn & infant parenting and family adjustment issues with other new mothers in a relaxed environment. Each week brings a new speaker or baby-centered activity. We encourage new mothers to join [place] every Thursday at 11:00am. Babies birth until crawling. No registration or fee. Daddy [employer]: This course offers Dads-to-be the tools and knowledge needed to feel confident on their journey to becoming new fathers. Experienced dads, who have been trained as coaches, teach dads-to-be how to hold, comfort, diaper, swaddle and play with their infant while being able to support the new mom as well. A class for men taught by men. $25/dad Big Brother/Big Sister: Let your children share in the joy of a new brother or sister in this special class designed just for them. Class includes discussion about how families care for babies: swaddling, holding, diapering, safety as well as how they can be helpful in their new role. This class is designed for children ages 2 to 12, but any age is welcome.  Please register each child individually. $5/child  Mom Talk: This mom-led group offers support and connection to mothers as they journey through the adjustments and struggles of that sometimes overwhelming first year after the birth of a child. Tuesdays at 10:00am and Thursdays at 6:00pm. Babies welcome. No registration or fee. Breastfeeding Support Group: This group is a mother-to-mother support circle where moms have the opportunity to share their breastfeeding experiences. A Lactation Consultant is present for questions and concerns. Meets each Tuesday at 11:00am. No fee or registration. Breastfeeding Your Baby: Learn what to expect in the first days of breastfeeding your newborn.  This class will help you feel more confident with the skills needed to begin your breastfeeding experience. Many new mothers are concerned about breastfeeding after leaving the hospital. This class will also address the most common fears and challenges about breastfeeding during the first few weeks, months and beyond. (call for fee) Comfort Techniques and Tour: This 2 hour interactive class will provide you the opportunity to learn & practice hands-on techniques that can help relieve some of the discomfort of labor and encourage your baby to rotate toward the best position for birth. You and your partner will be able to try a variety of labor positions with birth balls and rebozos as well as practice breathing, relaxation, and visualization techniques. A tour of the [facility] is included with this class. $20 per registrant and support person Childbirth Class- Weekend Option: This class is a Weekend version of our Birth & Baby series. It is designed for parents who have a difficult time fitting several  weeks of classes into their schedule. It covers the care of your newborn and the basics of labor and childbirth. It also includes a Maternity Care Center Tour of [facility] and lunch. The class is  held two consecutive days: beginning on Friday evening from 6:30 - 8:30 p.m. and the next day, Saturday from 9 a.m. - 4 p.m. (call for fee) Linden Dolin Class: Interested in a waterbirth?  This informational class will help you discover whether waterbirth is the right fit for you. Education about waterbirth itself, supplies you would need and how to assemble your support team is what you can expect from this class. Some obstetrical practices require this class in order to pursue a waterbirth. (Not all obstetrical practices offer waterbirth-check with your healthcare provider.) Register only the expectant mom, but you are encouraged to bring your partner to class! Required if planning waterbirth, no fee. Infant/Child CPR: Parents, grandparents, babysitters, and friends learn Cardio-Pulmonary Resuscitation skills for infants and children. You will also learn how to treat both conscious and unconscious choking in infants and children. This Family & Friends program does not offer certification. Register each participant individually to ensure that enough mannequins are available. (Call for fee) Grandparent Love: Expecting a grandbaby? This class is for you! Learn about the latest infant care and safety recommendations and ways to support your own child as he or she transitions into the parenting role. Taught by Registered Nurses who are childbirth instructors, but most importantly...they are grandmothers too! $10/person. Childbirth Class- Natural Childbirth: This series of 5 weekly classes is for expectant parents who want to learn and practice natural methods of coping with the process of labor and childbirth. Relaxation, breathing, massage, visualization, role of the partner, and helpful positioning are highlighted. Participants learn how to be confident in their body's ability to give birth. This class will empower and help parents make informed decisions about their own care. Includes discussion that will help new  parents transition into the immediate postpartum period. Maternity Care Center Tour of [facility] is included. We suggest taking this class between 25-32 weeks, but it's only a recommendation. $75 per registrant and one support person or $30 Medicaid. Childbirth Class- 3 week Series: This option of 3 weekly classes helps you and your labor partner prepare for childbirth. Newborn care, labor & birth, cesarean birth, pain management, and comfort techniques are discussed and a Maternity Care Center Tour of [facility] is included. The class meets at the same time, on the same day of the week for 3 consecutive weeks beginning with the starting date you choose. $60 for registrant and one support person.  Marvelous Multiples: Expecting twins, triplets, or more? This class covers the differences in labor, birth, parenting, and breastfeeding issues that face multiples' parents. NICU tour is included. Led by a Certified Childbirth Educator who is the mother of twins. No fee. Caring for Baby: This class is for expectant and adoptive parents who want to learn and practice the most up-to-date newborn care for their babies. Focus is on birth through the first six weeks of life. Topics include feeding, bathing, diapering, crying, umbilical cord care, circumcision care and safe sleep. Parents learn to recognize symptoms of illness and when to call the pediatrician. Register only the mom-to-be and your partner or support person can plan to come with you! $10 per registrant and support person Childbirth Class- online option: This online class offers you the freedom to complete a Birth and Baby series in the comfort of your  own home. The flexibility of this option allows you to review sections at your own pace, at times convenient to you and your support people. It includes additional video information, animations, quizzes, and extended activities. Get organized with helpful eClass tools, checklists, and trackers. Once  you register online for the class, you will receive an email within a few days to accept the invitation and begin the class when the time is right for you. The content will be available to you for 60 days. $60 for 60 days of online access for you and your support people.

## 2019-12-10 NOTE — Progress Notes (Signed)
   PRENATAL VISIT NOTE  Subjective:  Sara Jensen is a 24 y.o. G1P0 at [redacted]w[redacted]d being seen today for her first prenatal visit for this pregnancy.  She is currently monitored for the following issues for this low-risk pregnancy and has Supervision of low-risk pregnancy; Anxiety; and Depression on their problem list.  Patient reports no complaints.  Contractions: Not present. Vag. Bleeding: None.  Movement: Absent. Denies leaking of fluid.   She is planning to breastfeed. Desires contraception, undecided on type.   The following portions of the patient's history were reviewed and updated as appropriate: allergies, current medications, past family history, past medical history, past social history, past surgical history and problem list.   Objective:   Vitals:   12/10/19 0846  BP: 127/82  Pulse: 86  Weight: 141 lb 11.2 oz (64.3 kg)    Fetal Status: Fetal Heart Rate (bpm): 166   Movement: Absent     General:  Alert, oriented and cooperative. Patient is in no acute distress.  Skin: Skin is warm and dry. No rash noted.   Cardiovascular: Normal heart rate and rhythm noted  Respiratory: Normal respiratory effort, no problems with respiration noted. Clear to auscultation.   Abdomen: Soft, gravid, appropriate for gestational age. Normal bowel sounds. Non-tender. Pain/Pressure: Absent     Pelvic: Cervical exam performed Dilation: Closed Effacement (%): Thick   Uterus palpable and slightly enlarged.  Breast: symmetric, no masses, no discharge  Extremities: Normal range of motion.  Edema: None  Mental Status: Normal mood and affect. Normal behavior. Normal judgment and thought content.   Assessment and Plan:  Pregnancy: G1P0 at [redacted]w[redacted]d 1. Encounter for supervision of low-risk pregnancy in first trimester - [place] MFM OB COMP + 14 WK; Future - CBC/D/Plt+RPR+Rh+ABO+Rub Ab... - Culture, OB Urine - GC/Chlamydia probe amp ([place])not at [facility] - urine  - NIPS desired at next visit  - Pap performed at  age 54 years in [place], will send ROI for results   Preterm labor/first trimester warning symptoms and general obstetric precautions including but not limited to vaginal bleeding, contractions, leaking of fluid and fetal movement were reviewed in detail with the patient. Please refer to After Visit Summary for other counseling recommendations.   Discussed the normal visit cadence for prenatal care Discussed the nature of our practice with multiple providers including residents and students   Return in about 4 weeks (around 01/07/2020) for LOB, In-Person, any provider.  No future appointments.  Vonzella Nipple, PA-C

## 2019-12-11 LAB — CBC/D/PLT+RPR+RH+ABO+RUB AB...
Antibody Screen: NEGATIVE
Basophils Absolute: 0 10*3/uL (ref 0.0–0.2)
Basos: 0 %
EOS (ABSOLUTE): 0.1 10*3/uL (ref 0.0–0.4)
Eos: 1 %
HCV Ab: <0.1 s/co ratio (ref 0.0–0.9)
HIV Screen 4th Generation wRfx: NONREACTIVE
Hematocrit: 34.6 % (ref 34.0–46.6)
Hemoglobin: 11.2 g/dL (ref 11.1–15.9)
Hepatitis B Surface Ag: NEGATIVE
Immature Grans (Abs): 0 10*3/uL (ref 0.0–0.1)
Immature Granulocytes: 0 %
Lymphocytes Absolute: 1.8 10*3/uL (ref 0.7–3.1)
Lymphs: 19 %
MCH: 27.5 pg (ref 26.6–33.0)
MCHC: 32.4 g/dL (ref 31.5–35.7)
MCV: 85 fL (ref 79–97)
Monocytes Absolute: 0.8 10*3/uL (ref 0.1–0.9)
Monocytes: 8 %
Neutrophils Absolute: 6.9 10*3/uL (ref 1.4–7.0)
Neutrophils: 72 %
Platelets: 262 10*3/uL (ref 150–450)
RBC: 4.07 x10E6/uL (ref 3.77–5.28)
RDW: 14 % (ref 11.7–15.4)
RPR Ser Ql: NONREACTIVE
Rh Factor: POSITIVE
Rubella Antibodies, IGG: 5.19 index (ref >0.99)
WBC: 9.7 10*3/uL (ref 3.4–10.8)

## 2019-12-11 LAB — GC/CHLAMYDIA PROBE AMP (~~LOC~~) NOT AT ARMC
Chlamydia: NEGATIVE
Comment: NEGATIVE
Comment: NORMAL
Neisseria Gonorrhea: NEGATIVE

## 2019-12-11 LAB — HCV INTERPRETATION

## 2019-12-13 LAB — URINE CULTURE, OB REFLEX

## 2019-12-13 LAB — CULTURE, OB URINE

## 2020-01-04 ENCOUNTER — Emergency Department (HOSPITAL_COMMUNITY): Admission: EM | Admit: 2020-01-04 | Discharge: 2020-01-04 | Discharge status: Against Medical Advice | Payer: 59

## 2020-01-04 ENCOUNTER — Other Ambulatory Visit: Payer: Self-pay

## 2020-01-04 ENCOUNTER — Ambulatory Visit: Payer: Self-pay | Admitting: *Deleted

## 2020-01-04 NOTE — Telephone Encounter (Signed)
Patient is calling to report she woke with headache today- patient has dizziness, has tried Tylenol, eating/hydrating, BP- 133/90  Patient does have history of migraine- but usually associated with stress. Due to her pain level and dizziness- advised ED for evaluation and treatment. Reason for Disposition  [1] SEVERE headache (e.g., excruciating) AND [2] not improved after 2 hours of pain medicine (Exception: similar to previous migraines)  Answer Assessment - Initial Assessment Questions 1. LOCATION: "Where does it hurt?"      Middle of eyes- when stands- it is at base of head in back 2. ONSET: "When did the headache start?" (Minutes, hours or days)      When woke this morning 3. PATTERN: "Does the pain come and go, or has it been constant since it started?"     constant 4. SEVERITY: "How bad is the pain?" and "What does it keep you from doing?"  (e.g., Scale 1-10; mild, moderate, or severe)   - MILD (1-3): doesn't interfere with normal activities    - MODERATE (4-7): interferes with normal activities or awakens from sleep    - SEVERE (8-10): excruciating pain, unable to do any normal activities        Severe-9 5. RECURRENT SYMPTOM: "Have you ever had headaches before?" If Yes, ask: "When was the last time?" and "What happened that time?"      History of migraine 6. CAUSE: "What do you think is causing the headache?"     Patient is pregnant 7. MIGRAINE: "Have you been diagnosed with migraine headaches?" If Yes, ask: "Is this headache similar?"      Yes- pain is same 8. HEAD INJURY: "Has there been any recent injury to the head?"      no 9. OTHER SYMPTOMS: "Do you have any other symptoms?" (fever, stiff neck, eye pain, sore throat, cold symptoms)     dizziness 10. PREGNANCY: "Is there any chance you are pregnant?" "When was your last menstrual period?"       Yes- 13 weeks 1 day  Answer Assessment - Initial Assessment Questions 1. LOCATION: "Where does it hurt?"      Between eyes and  at base of head at neck in back 2. ONSET: "When did the headache start?" (Minutes, hours or days)      This morning when she woke 3. PATTERN: "Does the pain come and go, or has it been constant since it started?"     constant 4. SEVERITY: "How bad is the pain?" and "What does it keep you from doing?"    - MILD - doesn't interfere with normal activities    - MODERATE - interferes with normal activities or awakens from sleep    - SEVERE - excruciating pain, unable to do any normal activities        severe 5. RECURRENT SYMPTOM: "Have you ever had headaches before?" If Yes, ask: "When was the last time?" and "What happened that time?"      Yes- migraines 6. CAUSE: "What do you think is causing the headache?"     Pregnancy- migraine 7. MIGRAINE: "Have you been diagnosed with migraine headaches?" If Yes, ask: "Is this headache similar?"      Hx migraine- yes 8. HEAD INJURY: "Has there been any recent injury to the head?"      no 9. OTHER SYMPTOMS: "Do you have any other symptoms?" (e.g., fever, stiff neck, blurred vision; swelling of hands, face, or feet)     dizziness 10. PREGNANCY: "How many weeks  pregnant are you?"       yes 11. EDD: "What date are you expecting to deliver?"       07/10/20  Protocols used: PREGNANCY - HEADACHE-A-AH, HEADACHE-A-AH

## 2020-01-07 ENCOUNTER — Other Ambulatory Visit: Payer: Self-pay

## 2020-01-07 ENCOUNTER — Ambulatory Visit (INDEPENDENT_AMBULATORY_CARE_PROVIDER_SITE_OTHER): Payer: 59 | Admitting: Obstetrics and Gynecology

## 2020-01-07 VITALS — BP 118/79 | HR 73 | Wt 144.0 lb

## 2020-01-07 DIAGNOSIS — Z3A13 13 weeks gestation of pregnancy: Secondary | ICD-10-CM

## 2020-01-07 DIAGNOSIS — Z3491 Encounter for supervision of normal pregnancy, unspecified, first trimester: Secondary | ICD-10-CM

## 2020-01-07 NOTE — Patient Instructions (Signed)
Safe Medications in Pregnancy   Acne:  Benzoyl Peroxide  Salicylic Acid   Backache/Headache:  Tylenol: 2 regular strength every 4 hours OR        2 Extra strength every 6 hours  Recommend Excedrin Tension over regular Excedrin as it omits the high dose aspirin   Colds/Coughs/Allergies:  Benadryl (alcohol free) 25 mg every 6 hours as needed  Breath right strips  Claritin  Cepacol throat lozenges  Chloraseptic throat spray  Cold-Eeze- up to three times per day  Cough drops, alcohol free  Flonase (by prescription only)  Guaifenesin  Mucinex  Robitussin DM (plain only, alcohol free)  Saline nasal spray/drops  Sudafed (pseudoephedrine) & Actifed * use only after [redacted] weeks gestation and if you do not have high blood pressure  Tylenol  Vicks Vaporub  Zinc lozenges  Zyrtec   Constipation:  Colace  Ducolax suppositories  Fleet enema  Glycerin suppositories  Metamucil  Milk of magnesia  Miralax  Senokot  Smooth move tea   Diarrhea:  Kaopectate  Imodium A-D   *NO pepto Bismol   Hemorrhoids:  Anusol  Anusol HC  Preparation H  Tucks   Indigestion:  Tums  Maalox  Mylanta  Zantac  Pepcid   Insomnia:  Benadryl (alcohol free) 25mg  every 6 hours as needed  Tylenol PM  Unisom, no Gelcaps   Leg Cramps:  Tums  MagGel   Nausea/Vomiting:  Bonine  Dramamine  Emetrol  Ginger extract  Sea bands  Meclizine  Nausea medication to take during pregnancy:  Unisom (doxylamine succinate 25 mg tablets) Take one tablet daily at bedtime. If symptoms are not adequately controlled, the dose can be increased to a maximum recommended dose of two tablets daily (1/2 tablet in the morning, 1/2 tablet mid-afternoon and one at bedtime).  Vitamin B6 100mg  tablets. Take one tablet twice a day (up to 200 mg per day).   Skin Rashes:  Aveeno products  Benadryl cream or 25mg  every 6 hours as needed  Calamine Lotion  1% cortisone cream   Yeast infection:  Gyne-lotrimin 7   Monistat 7    **If taking multiple medications, please check labels to avoid duplicating the same active ingredients  **take medication as directed on the label  ** Do not exceed 4000 mg of tylenol in 24 hours  **Do not take medications that contain aspirin or ibuprofen

## 2020-01-07 NOTE — Progress Notes (Signed)
   PRENATAL VISIT NOTE  Subjective:  Sara Jensen is a 24 y.o. G1P0 at [redacted]w[redacted]d being seen today for ongoing prenatal care.  She is currently monitored for the following issues for this low-risk pregnancy and has Supervision of low-risk pregnancy; Anxiety; and Depression on their problem list.  Patient reports having a bad migraine last week. She has a history of migraines prior to pregnancy as well. Noted a high BP during this episode, but has had normal BP since then.  Contractions: Not present. Vag. Bleeding: None.  Movement: Absent. Denies leaking of fluid.   The following portions of the patient's history were reviewed and updated as appropriate: allergies, current medications, past family history, past medical history, past social history, past surgical history and problem list.   Objective:   Vitals:   01/07/20 0821  BP: 118/79  Pulse: 73  Weight: 144 lb (65.3 kg)    Fetal Status: Fetal Heart Rate (bpm): 150   Movement: Absent     General:  Alert, oriented and cooperative. Patient is in no acute distress.  Skin: Skin is warm and dry. No rash noted.   Cardiovascular: Normal heart rate noted  Respiratory: Normal respiratory effort, no problems with respiration noted  Abdomen: Soft, gravid, appropriate for gestational age.  Pain/Pressure: Present     Pelvic: Cervical exam deferred        Extremities: Normal range of motion.  Edema: None  Mental Status: Normal mood and affect. Normal behavior. Normal judgment and thought content.   Assessment and Plan:  Pregnancy: G1P0 at [redacted]w[redacted]d 1. Encounter for supervision of low-risk pregnancy in first trimester -NIPS ordered, desires to know gender  -discussed safe meds in pregnancy  -Covid Vaccinated, entered into chart    2. [redacted] weeks gestation of pregnancy    Preterm labor symptoms and general obstetric precautions including but not limited to vaginal bleeding, contractions, leaking of fluid and fetal movement were reviewed in detail with  the patient. Please refer to After Visit Summary for other counseling recommendations.   Return in about 4 weeks (around 02/04/2020) for OB, any provider.  Future Appointments  Date Time Provider Department Center  02/18/2020  9:30 AM [facility] NURSE [facility]  02/18/2020  9:45 AM [facility]    Gita Kudo, MD  OB Family Medicine Fellow, [facility] for [facility], [facility]

## 2020-02-01 ENCOUNTER — Telehealth (INDEPENDENT_AMBULATORY_CARE_PROVIDER_SITE_OTHER): Payer: 59 | Admitting: Lactation Services

## 2020-02-01 DIAGNOSIS — Z3492 Encounter for supervision of normal pregnancy, unspecified, second trimester: Secondary | ICD-10-CM

## 2020-02-01 NOTE — Telephone Encounter (Signed)
Called patient to let her know that her [employer] Genetic Screening shows that she is a silent carrier for Alpha Thalassemia.   Gave patient the phone number to [facility] to call ans set up a Genetic Counseling Session to discuss further. Reviewed that they will recommend Partner testing and will inform patient how to do that.   Patient with no questions or concerns at this time.

## 2020-02-05 ENCOUNTER — Encounter: Payer: Self-pay | Admitting: Advanced Practice Midwife

## 2020-02-05 ENCOUNTER — Other Ambulatory Visit: Payer: Self-pay

## 2020-02-05 ENCOUNTER — Ambulatory Visit (INDEPENDENT_AMBULATORY_CARE_PROVIDER_SITE_OTHER): Payer: 59 | Admitting: Advanced Practice Midwife

## 2020-02-05 ENCOUNTER — Encounter: Payer: Self-pay | Admitting: General Practice

## 2020-02-05 VITALS — BP 106/78 | HR 82 | Wt 149.0 lb

## 2020-02-05 DIAGNOSIS — Z3492 Encounter for supervision of normal pregnancy, unspecified, second trimester: Secondary | ICD-10-CM

## 2020-02-05 DIAGNOSIS — Z3A17 17 weeks gestation of pregnancy: Secondary | ICD-10-CM

## 2020-02-05 NOTE — Patient Instructions (Signed)
Levonorgestrel intrauterine device (IUD) What is this medicine? LEVONORGESTREL IUD (LEE voe nor jes trel) is a contraceptive (birth control) device. The device is placed inside the uterus by a healthcare professional. It is used to prevent pregnancy. This device can also be used to treat heavy bleeding that occurs during your period. This medicine may be used for other purposes; ask your health care provider or pharmacist if you have questions. COMMON BRAND NAME(S): Kyleena, LILETTA, Mirena, Skyla What should I tell my health care provider before I take this medicine? They need to know if you have any of these conditions:  abnormal Pap smear  cancer of the breast, uterus, or cervix  diabetes  endometritis  genital or pelvic infection now or in the past  have more than one sexual partner or your partner has more than one partner  heart disease  history of an ectopic or tubal pregnancy  immune system problems  IUD in place  liver disease or tumor  problems with blood clots or take blood-thinners  seizures  use intravenous drugs  uterus of unusual shape  vaginal bleeding that has not been explained  an unusual or allergic reaction to levonorgestrel, other hormones, silicone, or polyethylene, medicines, foods, dyes, or preservatives  pregnant or trying to get pregnant  breast-feeding How should I use this medicine? This device is placed inside the uterus by a health care professional. Talk to your pediatrician regarding the use of this medicine in children. Special care may be needed. Overdosage: If you think you have taken too much of this medicine contact a poison control center or emergency room at once. NOTE: This medicine is only for you. Do not share this medicine with others. What if I miss a dose? This does not apply. Depending on the brand of device you have inserted, the device will need to be replaced every 3 to 6 years if you wish to continue using this type  of birth control. What may interact with this medicine? Do not take this medicine with any of the following medications:  amprenavir  bosentan  fosamprenavir This medicine may also interact with the following medications:  aprepitant  armodafinil  barbiturate medicines for inducing sleep or treating seizures  bexarotene  boceprevir  griseofulvin  medicines to treat seizures like carbamazepine, ethotoin, felbamate, oxcarbazepine, phenytoin, topiramate  modafinil  pioglitazone  rifabutin  rifampin  rifapentine  some medicines to treat HIV infection like atazanavir, efavirenz, indinavir, lopinavir, nelfinavir, tipranavir, ritonavir  St. John's wort  warfarin This list may not describe all possible interactions. Give your health care provider a list of all the medicines, herbs, non-prescription drugs, or dietary supplements you use. Also tell them if you smoke, drink alcohol, or use illegal drugs. Some items may interact with your medicine. What should I watch for while using this medicine? Visit your doctor or health care professional for regular check ups. See your doctor if you or your partner has sexual contact with others, becomes HIV positive, or gets a sexual transmitted disease. This product does not protect you against HIV infection (AIDS) or other sexually transmitted diseases. You can check the placement of the IUD yourself by reaching up to the top of your vagina with clean fingers to feel the threads. Do not pull on the threads. It is a good habit to check placement after each menstrual period. Call your doctor right away if you feel more of the IUD than just the threads or if you cannot feel the threads at   all. The IUD may come out by itself. You may become pregnant if the device comes out. If you notice that the IUD has come out use a backup birth control method like condoms and call your health care provider. Using tampons will not change the position of the  IUD and are okay to use during your period. This IUD can be safely scanned with magnetic resonance imaging (MRI) only under specific conditions. Before you have an MRI, tell your healthcare provider that you have an IUD in place, and which type of IUD you have in place. What side effects may I notice from receiving this medicine? Side effects that you should report to your doctor or health care professional as soon as possible:  allergic reactions like skin rash, itching or hives, swelling of the face, lips, or tongue  fever, flu-like symptoms  genital sores  high blood pressure  no menstrual period for 6 weeks during use  pain, swelling, warmth in the leg  pelvic pain or tenderness  severe or sudden headache  signs of pregnancy  stomach cramping  sudden shortness of breath  trouble with balance, talking, or walking  unusual vaginal bleeding, discharge  yellowing of the eyes or skin Side effects that usually do not require medical attention (report to your doctor or health care professional if they continue or are bothersome):  acne  breast pain  change in sex drive or performance  changes in weight  cramping, dizziness, or faintness while the device is being inserted  headache  irregular menstrual bleeding within first 3 to 6 months of use  nausea This list may not describe all possible side effects. Call your doctor for medical advice about side effects. You may report side effects to FDA at 1-800-FDA-1088. Where should I keep my medicine? This does not apply. NOTE: This sheet is a summary. It may not cover all possible information. If you have questions about this medicine, talk to your doctor, pharmacist, or health care provider.  2020 Elsevier/Gold Standard (2018-03-28 13:22:01)  

## 2020-02-05 NOTE — Progress Notes (Signed)
   PRENATAL VISIT NOTE  Subjective:  Sara Jensen is a 24 y.o. G1P0 at [redacted]w[redacted]d being seen today for ongoing prenatal care.  She is currently monitored for the following issues for this low-risk pregnancy and has Supervision of low-risk pregnancy; Anxiety; and Depression on their problem list.  Patient reports no complaints.  Contractions: Not present. Vag. Bleeding: None.  Movement: Absent. Denies leaking of fluid.   The following portions of the patient's history were reviewed and updated as appropriate: allergies, current medications, past family history, past medical history, past social history, past surgical history and problem list.   Objective:   Vitals:   02/05/20 0907  BP: 106/78  Pulse: 82  Weight: 149 lb (67.6 kg)    Fetal Status: Fetal Heart Rate (bpm): 143   Movement: Absent     General:  Alert, oriented and cooperative. Patient is in no acute distress.  Skin: Skin is warm and dry. No rash noted.   Cardiovascular: Normal heart rate noted  Respiratory: Normal respiratory effort, no problems with respiration noted  Abdomen: Soft, gravid, appropriate for gestational age.  Pain/Pressure: Absent     Pelvic: Cervical exam deferred        Extremities: Normal range of motion.  Edema: None  Mental Status: Normal mood and affect. Normal behavior. Normal judgment and thought content.   Assessment and Plan:  Pregnancy: G1P0 at 103w5d 1. Encounter for supervision of low-risk pregnancy in second trimester - [place] appt scheduled   2. [redacted] weeks gestation of pregnancy - AFP, Serum, Open Spina Bifida  Preterm labor symptoms and general obstetric precautions including but not limited to vaginal bleeding, contractions, leaking of fluid and fetal movement were reviewed in detail with the patient. Please refer to After Visit Summary for other counseling recommendations.   Return in about 4 weeks (around 03/04/2020) for virtual visit .  Future Appointments  Date Time Provider Department  Center  02/18/2020  9:30 AM [facility] NURSE [facility]  02/18/2020  9:45 AM [facility]   Thressa Sheller DNP, CNM  02/05/20  9:18 AM

## 2020-02-06 LAB — AFP, SERUM, OPEN SPINA BIFIDA
AFP MoM: 1.64
AFP Value: 74.5 ng/mL
Gest. Age on Collection Date: 17.3 weeks
Maternal Age At EDD: 23.8 yr
OSBR Risk 1 IN: 3791
Test Results:: NEGATIVE
Weight: 149 [lb_av]

## 2020-02-18 ENCOUNTER — Encounter: Payer: Self-pay | Admitting: *Deleted

## 2020-02-18 ENCOUNTER — Ambulatory Visit: Payer: 59 | Admitting: *Deleted

## 2020-02-18 ENCOUNTER — Other Ambulatory Visit: Payer: Self-pay

## 2020-02-18 ENCOUNTER — Ambulatory Visit: Payer: 59 | Attending: Obstetrics and Gynecology

## 2020-02-18 ENCOUNTER — Encounter: Payer: Self-pay | Admitting: Medical

## 2020-02-18 ENCOUNTER — Other Ambulatory Visit: Payer: Self-pay | Admitting: Medical

## 2020-02-18 DIAGNOSIS — Z3491 Encounter for supervision of normal pregnancy, unspecified, first trimester: Secondary | ICD-10-CM

## 2020-02-18 DIAGNOSIS — Z3A19 19 weeks gestation of pregnancy: Secondary | ICD-10-CM | POA: Diagnosis not present

## 2020-02-18 DIAGNOSIS — Z363 Encounter for antenatal screening for malformations: Secondary | ICD-10-CM | POA: Insufficient documentation

## 2020-02-18 DIAGNOSIS — O358XX0 Maternal care for other (suspected) fetal abnormality and damage, not applicable or unspecified: Secondary | ICD-10-CM | POA: Diagnosis not present

## 2020-02-18 DIAGNOSIS — Z148 Genetic carrier of other disease: Secondary | ICD-10-CM

## 2020-02-18 DIAGNOSIS — Z3492 Encounter for supervision of normal pregnancy, unspecified, second trimester: Secondary | ICD-10-CM | POA: Insufficient documentation

## 2020-02-18 DIAGNOSIS — O321XX0 Maternal care for breech presentation, not applicable or unspecified: Secondary | ICD-10-CM

## 2020-02-18 DIAGNOSIS — F419 Anxiety disorder, unspecified: Secondary | ICD-10-CM

## 2020-02-18 DIAGNOSIS — O283 Abnormal ultrasonic finding on antenatal screening of mother: Secondary | ICD-10-CM | POA: Insufficient documentation

## 2020-02-18 DIAGNOSIS — Z3689 Encounter for other specified antenatal screening: Secondary | ICD-10-CM | POA: Insufficient documentation

## 2020-03-10 ENCOUNTER — Telehealth (INDEPENDENT_AMBULATORY_CARE_PROVIDER_SITE_OTHER): Payer: 59 | Admitting: Student

## 2020-03-10 DIAGNOSIS — F32A Depression, unspecified: Secondary | ICD-10-CM | POA: Diagnosis not present

## 2020-03-10 DIAGNOSIS — F419 Anxiety disorder, unspecified: Secondary | ICD-10-CM | POA: Diagnosis not present

## 2020-03-10 DIAGNOSIS — O99340 Other mental disorders complicating pregnancy, unspecified trimester: Secondary | ICD-10-CM

## 2020-03-10 DIAGNOSIS — O283 Abnormal ultrasonic finding on antenatal screening of mother: Secondary | ICD-10-CM

## 2020-03-10 DIAGNOSIS — Z3492 Encounter for supervision of normal pregnancy, unspecified, second trimester: Secondary | ICD-10-CM

## 2020-03-10 DIAGNOSIS — O99342 Other mental disorders complicating pregnancy, second trimester: Secondary | ICD-10-CM | POA: Diagnosis not present

## 2020-03-10 DIAGNOSIS — Z3A22 22 weeks gestation of pregnancy: Secondary | ICD-10-CM

## 2020-03-10 NOTE — Patient Instructions (Signed)
Glucose Tolerance Test During Pregnancy Why am I having this test? The glucose tolerance test (GTT) is done to check how your body processes sugar (glucose). This is one of several tests used to diagnose diabetes that develops during pregnancy (gestational diabetes mellitus). Gestational diabetes is a temporary form of diabetes that some women develop during pregnancy. It usually occurs during the second trimester of pregnancy and goes away after delivery. Testing (screening) for gestational diabetes usually occurs between 24 and 28 weeks of pregnancy. You may have the GTT test after having a 1-hour glucose screening test if the results from that test indicate that you may have gestational diabetes. You may also have this test if:  You have a history of gestational diabetes.  You have a history of giving birth to very large babies or have experienced repeated fetal loss (stillbirth).  You have signs and symptoms of diabetes, such as: ? Changes in your vision. ? Tingling or numbness in your hands or feet. ? Changes in hunger, thirst, and urination that are not otherwise explained by your pregnancy. What is being tested? This test measures the amount of glucose in your blood at different times during a period of 3 hours. This indicates how well your body is able to process glucose. What kind of sample is taken?  Blood samples are required for this test. They are usually collected by inserting a needle into a blood vessel. How do I prepare for this test?  For 3 days before your test, eat normally. Have plenty of carbohydrate-rich foods.  Follow instructions from your health care provider about: ? Eating or drinking restrictions on the day of the test. You may be asked to not eat or drink anything other than water (fast) starting 8-10 hours before the test. ? Changing or stopping your regular medicines. Some medicines may interfere with this test. Tell a health care provider about:  All  medicines you are taking, including vitamins, herbs, eye drops, creams, and over-the-counter medicines.  Any blood disorders you have.  Any surgeries you have had.  Any medical conditions you have. What happens during the test? First, your blood glucose will be measured. This is referred to as your fasting blood glucose, since you fasted before the test. Then, you will drink a glucose solution that contains a certain amount of glucose. Your blood glucose will be measured again 1, 2, and 3 hours after drinking the solution. This test takes about 3 hours to complete. You will need to stay at the testing location during this time. During the testing period:  Do not eat or drink anything other than the glucose solution.  Do not exercise.  Do not use any products that contain nicotine or tobacco, such as cigarettes and e-cigarettes. If you need help stopping, ask your health care provider. The testing procedure may vary among health care providers and hospitals. How are the results reported? Your results will be reported as milligrams of glucose per deciliter of blood (mg/dL) or millimoles per liter (mmol/L). Your health care provider will compare your results to normal ranges that were established after testing a large group of people (reference ranges). Reference ranges may vary among labs and hospitals. For this test, common reference ranges are:  Fasting: less than 95-105 mg/dL (5.3-5.8 mmol/L).  1 hour after drinking glucose: less than 180-190 mg/dL (10.0-10.5 mmol/L).  2 hours after drinking glucose: less than 155-165 mg/dL (8.6-9.2 mmol/L).  3 hours after drinking glucose: 140-145 mg/dL (7.8-8.1 mmol/L). What do the   results mean? Results within reference ranges are considered normal, meaning that your glucose levels are well-controlled. If two or more of your blood glucose levels are high, you may be diagnosed with gestational diabetes. If only one level is high, your health care  provider may suggest repeat testing or other tests to confirm a diagnosis. Talk with your health care provider about what your results mean. Questions to ask your health care provider Ask your health care provider, or the department that is doing the test:  When will my results be ready?  How will I get my results?  What are my treatment options?  What other tests do I need?  What are my next steps? Summary  The glucose tolerance test (GTT) is one of several tests used to diagnose diabetes that develops during pregnancy (gestational diabetes mellitus). Gestational diabetes is a temporary form of diabetes that some women develop during pregnancy.  You may have the GTT test after having a 1-hour glucose screening test if the results from that test indicate that you may have gestational diabetes. You may also have this test if you have any symptoms or risk factors for gestational diabetes.  Talk with your health care provider about what your results mean. This information is not intended to replace advice given to you by your health care provider. Make sure you discuss any questions you have with your health care provider. Document Revised: 09/07/2018 Document Reviewed: 12/27/2016 Elsevier Patient Education  2020 Elsevier Inc.  

## 2020-03-10 NOTE — Progress Notes (Signed)
Patient ID: Sara Jensen, female   DOB: 03/18/1996, 24 y.o.   MRN: [id number] I connected with Sara Jensen 03/10/20 at 10:35 AM EDT by: MyChart video and verified that I am speaking with the correct person using two identifiers.  Patient is located at work and provider is located at [facility].     The purpose of this virtual visit is to provide medical care while limiting exposure to the novel coronavirus. I discussed the limitations, risks, security and privacy concerns of performing an evaluation and management service by MyChart video and the availability of in person appointments. I also discussed with the patient that there may be a patient responsible charge related to this service. By engaging in this virtual visit, you consent to the provision of healthcare.  Additionally, you authorize for your insurance to be billed for the services provided during this visit.  The patient expressed understanding and agreed to proceed.  The following staff members participated in the virtual visit:  Fleet Contras    PRENATAL VISIT NOTE  Subjective:  Sara Jensen is a 24 y.o. G1P0 at 107w4d  for phone visit for ongoing prenatal care.  She is currently monitored for the following issues for this low-risk pregnancy and has Supervision of low-risk pregnancy; Anxiety; Depression; and Echogenic intracardiac focus of fetus on prenatal ultrasound on their problem list.  Patient reports no complaints. She .  Contractions: Not present. Vag. Bleeding: None.  Movement: Present. Denies leaking of fluid.   The following portions of the patient's history were reviewed and updated as appropriate: allergies, current medications, past family history, past medical history, past social history, past surgical history and problem list.   Objective:  There were no vitals filed for this visit. Self-Obtained  Fetal Status:     Movement: Present     Assessment and Plan:  Pregnancy: G1P0 at [redacted]w[redacted]d 1. Echogenic intracardiac focus of fetus on  prenatal ultrasound Explained in detail how EIC is not concerning in the setting of other normal results (nips and anatomy scan). Patient understands that [place] is not medically necessary, but she can opt for amniocentesis if she wants in the future. She is also welcome to go to private [place] company, but these are not considered medical [place].  2. Encounter for supervision of low-risk pregnancy in second trimester  -Partner does not want to be tested for alpha thalassemia trait -anticipatory guidance given regarding 2 hour glucola, detailed instructions posted in patients My Chart. Patient agrees to check BP weekly and agrees to next visit in 6 weeks. Reviewed warning signs and BP values.   3. Anxiety   4. Depression during pregnancy, antepartum   Preterm labor symptoms and general obstetric precautions including but not limited to vaginal bleeding, contractions, leaking of fluid and fetal movement were reviewed in detail with the patient.  No follow-ups on file.  No future appointments.   Time spent on virtual visit: 20 minutes  Marylene Land, CNM

## 2020-03-10 NOTE — Progress Notes (Signed)
I connected with  Sara Jensen on 03/10/20 at 1030 by telephone and verified that I am speaking with the correct person using two identifiers.   I discussed the limitations, risks, security and privacy concerns of performing an evaluation and management service by telephone and the availability of in person appointments. I also discussed with the patient that there may be a patient responsible charge related to this service. The patient expressed understanding and agreed to proceed.  Patient is at work and does not have BP cuff available. Denies any s/s of htn. Agrees to check BP once at home and to log into [employer].  Marjo Bicker, RN 03/10/2020  10:30 AM

## 2020-04-09 ENCOUNTER — Other Ambulatory Visit: Payer: Self-pay

## 2020-04-09 DIAGNOSIS — Z3490 Encounter for supervision of normal pregnancy, unspecified, unspecified trimester: Secondary | ICD-10-CM

## 2020-04-16 ENCOUNTER — Other Ambulatory Visit: Payer: Self-pay

## 2020-04-16 ENCOUNTER — Other Ambulatory Visit: Payer: 59

## 2020-04-16 ENCOUNTER — Ambulatory Visit (INDEPENDENT_AMBULATORY_CARE_PROVIDER_SITE_OTHER): Payer: 59 | Admitting: Student

## 2020-04-16 VITALS — BP 124/72 | HR 82 | Wt 173.9 lb

## 2020-04-16 DIAGNOSIS — Z3490 Encounter for supervision of normal pregnancy, unspecified, unspecified trimester: Secondary | ICD-10-CM

## 2020-04-16 DIAGNOSIS — Z3A27 27 weeks gestation of pregnancy: Secondary | ICD-10-CM

## 2020-04-16 DIAGNOSIS — Z23 Encounter for immunization: Secondary | ICD-10-CM | POA: Diagnosis not present

## 2020-04-16 DIAGNOSIS — Z3493 Encounter for supervision of normal pregnancy, unspecified, third trimester: Secondary | ICD-10-CM | POA: Diagnosis not present

## 2020-04-16 NOTE — Patient Instructions (Signed)

## 2020-04-16 NOTE — Progress Notes (Signed)
   PRENATAL VISIT NOTE  Subjective:  Sara Jensen is a 24 y.o. G1P0 at [redacted]w[redacted]d being seen today for ongoing prenatal care.  She is currently monitored for the following issues for this low-risk pregnancy and has Supervision of low-risk pregnancy; Anxiety; Depression; and Echogenic intracardiac focus of fetus on prenatal ultrasound on their problem list.  Patient reports no complaints. She wants a in person visits because she is getting billed for her virtual and she is worried about the baby's growth. She wants to know how we will know if he is growing too big or too little.  Contractions: Not present. Vag. Bleeding: None.  Movement: Present. Denies leaking of fluid.   The following portions of the patient's history were reviewed and updated as appropriate: allergies, current medications, past family history, past medical history, past social history, past surgical history and problem list.   Objective:   Vitals:   04/16/20 0944  BP: 124/72  Pulse: 82  Weight: 173 lb 14.4 oz (78.9 kg)    Fetal Status: Fetal Heart Rate (bpm): 141 Fundal Height: 27 cm Movement: Present     General:  Alert, oriented and cooperative. Patient is in no acute distress.  Skin: Skin is warm and dry. No rash noted.   Cardiovascular: Normal heart rate noted  Respiratory: Normal respiratory effort, no problems with respiration noted  Abdomen: Soft, gravid, appropriate for gestational age.  Pain/Pressure: Absent     Pelvic: Cervical exam deferred        Extremities: Normal range of motion.  Edema: None  Mental Status: Normal mood and affect. Normal behavior. Normal judgment and thought content.   Assessment and Plan:  Pregnancy: G1P0 at [redacted]w[redacted]d 1. Encounter for supervision of low-risk pregnancy in third trimester -Reassurance and guidance given on fetal growth; reassured her that we order [place] if growth is concern; she will go to [employer] in order to get pictures of baby in the meantime.  - Tdap vaccine greater than  or equal to 7yo IM  Preterm labor symptoms and general obstetric precautions including but not limited to vaginal bleeding, contractions, leaking of fluid and fetal movement were reviewed in detail with the patient. Please refer to After Visit Summary for other counseling recommendations.   Return in about 2 weeks (around 04/30/2020), or LROB with [facility].  No future appointments.  Marylene Land, CNM

## 2020-04-17 LAB — CBC
Hematocrit: 34.2 % (ref 34.0–46.6)
Hemoglobin: 11.3 g/dL (ref 11.1–15.9)
MCH: 28.2 pg (ref 26.6–33.0)
MCHC: 33 g/dL (ref 31.5–35.7)
MCV: 85 fL (ref 79–97)
Platelets: 276 10*3/uL (ref 150–450)
RBC: 4.01 x10E6/uL (ref 3.77–5.28)
RDW: 12.6 % (ref 11.7–15.4)
WBC: 12.4 10*3/uL — ABNORMAL HIGH (ref 3.4–10.8)

## 2020-04-17 LAB — GLUCOSE TOLERANCE, 2 HOURS W/ 1HR
Glucose, 1 hour: 108 mg/dL (ref 65–179)
Glucose, 2 hour: 118 mg/dL (ref 65–152)
Glucose, Fasting: 79 mg/dL (ref 65–91)

## 2020-04-17 LAB — RPR: RPR Ser Ql: NONREACTIVE

## 2020-04-17 LAB — HIV ANTIBODY (ROUTINE TESTING W REFLEX): HIV Screen 4th Generation wRfx: NONREACTIVE

## 2020-05-06 ENCOUNTER — Other Ambulatory Visit: Payer: Self-pay

## 2020-05-06 ENCOUNTER — Ambulatory Visit (INDEPENDENT_AMBULATORY_CARE_PROVIDER_SITE_OTHER): Payer: 59 | Admitting: Nurse Practitioner

## 2020-05-06 VITALS — BP 120/72 | HR 91 | Wt 182.0 lb

## 2020-05-06 DIAGNOSIS — Z3493 Encounter for supervision of normal pregnancy, unspecified, third trimester: Secondary | ICD-10-CM

## 2020-05-06 DIAGNOSIS — Z3A30 30 weeks gestation of pregnancy: Secondary | ICD-10-CM

## 2020-05-06 DIAGNOSIS — Z8659 Personal history of other mental and behavioral disorders: Secondary | ICD-10-CM

## 2020-05-06 DIAGNOSIS — O26899 Other specified pregnancy related conditions, unspecified trimester: Secondary | ICD-10-CM

## 2020-05-06 DIAGNOSIS — R519 Headache, unspecified: Secondary | ICD-10-CM

## 2020-05-06 NOTE — Patient Instructions (Addendum)
[email or web address] for childbirth and breastfeeding  AREA PEDIATRIC/FAMILY PRACTICE PHYSICIANS  Central/Southeast [place] ([id number]) . [facility] Melodie Bouillon, MD; Lum Babe, MD; Sheffield Slider, MD; Leveda Anna, MD; McDiarmid, MD; Jerene Bears, MD; Jennette Kettle, MD; Gwendolyn Grant, MD o [address] [place], [place] [address] o [phone] o Mon-Fri 8:30-12:30, 1:30-5:00 o Providers come to see babies at [facility] o Accepting Medicaid . [facility] o Limited providers who accept newborns: Docia Chuck, MD; Kateri Plummer, MD; Paulino Rily, MD o [address], [place], [place] [address] o [phone] o Mon-Fri 8:00-5:30 o Babies seen by providers at [facility] o Does NOT accept Medicaid o Please call early in hospitalization for appointment (limited availability)  . [facility] Fatima Sanger, MD o [address] [place], [place] [address] o [phone] o Mon, Tue, Thur, Fri 8:30-5:00, Wed 10:00-7:00 (closed 1-2pm) o Babies seen by [facility] providers o Accepting Medicaid . Donnie Coffin - Pediatrician Fae Pippin, MD o [address], [place], [place] [address] o [phone] o Mon-Fri 8:30-5:00, Sat 8:30-12:00 o Provider comes to see babies at [facility] o Accepting Medicaid o Must have been referred from current patients or contacted office prior to delivery . [facility]) Leotis Pain, MD; Ave Filter, MD; Luna Fuse, MD; Kennedy Bucker, MD; Konrad Dolores, MD; Kathlene November, MD; Jenne Campus, MD; Lubertha South, MD; Wynetta Emery, MD; Duffy Rhody, MD; Gerre Couch, NP; Shirl Harris, NP o [address], [place], [place] [address] o [phone] o Mon, Tue, Thur, Fri 8:30-5:30, Wed 9:30-5:30, Sat 8:30-12:30 o Babies seen by [facility] providers o Accepting Medicaid o Only accepting infants of first-time parents or siblings of current patients [facility] discharge coordinator will make  follow-up appointment . Cyril Mourning o [address], [place], [place]  [address] o [phone]   Fax - [phone] . [facility] o [address], [place], [place]  [address] o Phone - [phone]   Fax - [phone] . Lucio Edward o [address], [place], [place]  [address] o [phone]  East/[place]ortheast [place] [phone]) . [facility] Jorge Mandril, MD; Alita Chyle, MD; Princella Ion, MD; MD; Earlene Plater, MD; Jamesetta Orleans, MD; Alvera Novel, MD; Clarene Duke, MD; Rana Snare, MD; Carmon Ginsberg, MD; Alinda Money, MD; Hosie Poisson, MD; Mayford Knife, MD o [address] [place], [place] [address] o [phone] o Mon-Fri 8:30-5:00 (extended evenings Mon-Thur as needed), Sat-Sun 10:00-1:00 o Providers come to see babies at [facility] o Accepting Medicaid for families of first-time babies and families with all children in the household age 37 and under. Must register with office prior to making appointment (M-F only). [facility] Odella Aquas, NP; Lynelle Doctor, MD; Susann Givens, MD; [place], [place] o [address] [place], [place] [address] o [phone] o Mon-Fri 8:00-5:00 o Babies seen by providers at [facility] o Does NOT accept Medicaid/Commercial Insurance Only . [facility] at [facility])  Suzette Battiest, MD; Zachery Dauer, MD; Stefan Church, MD; Sabino Dick, MD; Quitman Livings, MD; Farris Has, MD; Gaynell Face, MD; Betha Loa, MD; Colon Flattery, MD; Clifton James, MD o [address] [place], [place] [address] o [phone] o Mon-Fri 8:30-5:30, Sat (Oct.-Mar.) 9:00-1:00 o Babies seen by providers at [facility] o Accepting [facility] [phone]) . [facility] Dimitri, MD; Sheliah Hatch, MD o [address], [place], [place] [address] o [phone] o Mon-Fri 8:30-5:00, Sat 8:30-12:00 o Providers come to see babies at [facility] o Does NOT accept  Medicaid . [facility] Cindy Hazy, [place]; [place], MD; [place],  [place]; Wynelle Link, MD; Azucena Cecil, MD o [address] [place], [place] [address] o [phone] o Mon-Fri 8:00-5:00 o Babies seen by providers at [facility] o Does NOT accept Medicaid o Only accepting babies of parents who are patients o Please call early in hospitalization for appointment (limited availability) . [facility] Lamar Benes, MD; Abran Cantor, MD; Early Osmond, MD; Cherre Huger, NP; Hyacinth Meeker, MD; Dwan Bolt, MD; Jarold Motto, NP; Dario Guardian, MD; Talmage Nap, MD; Maisie Fus, MD; Pricilla Holm, MD; Tama High, MD o [address], [place], [place] [address] o [phone] o Mon-Fri 8:00-5:00, Sat 9:00-12:00 o Providers come to see babies at [facility] o Does NOT accept [facility] [phone]) . [facility] at [facility] o Limited providers accepting new patients: Drema Pry, NP; [place], [place] o [address], [place], [place] [address] o [phone] o Mon-Fri 8:00-5:00 o Babies seen by providers at [facility] o Does NOT accept Medicaid o Only accepting babies of parents who are patients o Please call early in hospitalization for appointment (limited availability) . [facility] Luan Pulling, MD; Nash Dimmer, MD o [address] [place] [place], [place] [address] o [phone] (press 1 to schedule appointment) o Mon-Fri 8:00-5:00 o Providers come to see babies at [facility] o Does NOT accept Medicaid . [facility] Cristino Martes, MD o [address], [place], [place] [address] o [phone] o Mon-Fri 8:30-5:00 (lunch 12:30-1:00), extended hours by appointment only Wed 5:00-6:30 o Babies seen by [facility] providers o Accepting Medicaid . [facility] Abbot, MD; [place], MD; Hassan Rowan, MD o [address] [place], [place], [place] [address] o [phone] o Mon-Fri 8:00-5:00 o Babies seen by [facility] providers o Does NOT accept Medicaid . Nature conservation officer at [facility] [place] [address] Elsworth Soho, MD; Durene Cal, MD; Notus, DO o  [address] [place], [place] [address] o [phone] o Mon-Fri 8:00-5:00 o Babies seen by [facility] providers o Does NOT accept Medicaid . [facility] o [place], [place]; [place], [place]; [place], NP; Avis Epley, MD; Vonna Kotyk, MD; Clance Boll, MD; Stevphen Rochester, NP; Arvilla Market, NP; Ann Maki, NP; Otis Dials, NP; Vaughan Basta, MD; [place], MD o [address] [place], [place] [address] o [phone] o Mon-Fri 8:30-5:00, Sat 10:00-1:00 o Providers come to see babies at [facility] o Does NOT accept Medicaid o Free prenatal information session Tuesdays at 4:45pm . [facility] Luna Kitchens, MD; [place], [place]; [place], [place]; [place], [place] o [address] [place] [address] o [phone] o Mon-Fri 7:30-5:30 o Babies seen by [facility] providers . [facility] o [address], [place], [place]  [address] o [phone]   Fax - [phone]  [place] [phone] & [phone]) . [facility] Alphonsa Overall, MD o [address] [place], [place] [address] o [phone] o Mon-Thur 8:00-6:00 o Providers come to see babies at [facility] o Accepting Medicaid . [facility] Zenon Mayo, NP; Cyndia Bent, MD; [place], [place]; [place], [place] o [address] [place], [place] [address] o [phone] o Mon-Thur 7:30-7:30, Fri 7:30-4:30 o Babies seen by [facility] providers o Accepting Medicaid . [facility] Cheryle Horsfall, MD; Janene Harvey, NP; Vonita Moss, MD o [address], [place], [place] [address] o [phone] o Mon-Fri 8:30-5:00, Sat 8:30-12:00 o Providers come to see babies at [facility] o Accepting Medicaid o Must have "Meet & Greet" appointment at office prior to delivery . [facility] [place] (  [facility]) Llana Aliment, MD; Earlene Plater, MD; Lucretia Roers, MD o [address], [place], [place] [address] o [phone] o Mon-Wed 8:00-6:00, Thur-Fri  8:00-5:00, Sat 9:00-12:00 o Providers come to see babies at [facility] o Does NOT accept Medicaid o Only accepting siblings of current patients . [facility]  o [address], [place], [place]  [address] o [phone]   Fax - [phone] . [facility] at [facility] o [phone] [address] [place], [place]  [address] o [phone]   Fax - [phone]  [place] [phone] & [phone]) . Nature conservation officer at [facility] o Ryder, DO; [place], DO o [address] [place], [place] [address] o [phone] o Mon-Fri 7:00-5:00 o Babies seen by [facility] providers o Does NOT accept Medicaid . [facility] Ellis Savage, MD; [place], [place]; [place], [place] o [address], [place], [place] [address] o [phone] o Mon-Fri 8:00-5:00 o Babies seen by [facility] providers o Accepting Medicaid . [facility] - [address] Franne Forts, MD; [place], [place]; [place], NP; [place], [place] o [address] [place], [place], [place] [address] o [phone] o Mon-Fri 8:00-5:00 o Babies seen by providers at [facility] o Accepting [facility] [phone]) . [facility] [place], [place] o [address] [place], [place] [address] o [phone] o Mon-Fri 8:00-5:00 o Babies seen by [facility] providers o Does NOT accept Medicaid o Limited availability, please call early in hospitalization to schedule follow-up . [facility] Jolee Ewing, PA; Eddie Candle, MD; [place], MD; [place], [place]; Constance Goltz, MD; [place], [place] o [address] [facility] [address], [place], [place] [address] o [phone] o Mon-Fri 8:30-5:00, Sat 9:00-12:00 o Babies seen by providers at [facility] o Accepting Medicaid o Please register online then schedule online or call office o [email or web address] . [facility]  [facility] at [facility]) Samuella Bruin, NP; Lucianne Muss, MD; Lanier Clam, PA o [address], [place], [place] [address] o [phone] o Mon-Fri 8:00-5:00 o Babies seen by providers at [facility] o Accepting Medicaid . [facility] ([facility] [employer]) Sharin Mons, MD; Reed Breech, NP; Shelva Majestic, MD o [address], [place], [place] [address] o [phone] o Mon-Fri 8:00-5:30, Sat&Sun by appointment (phones open at 8:30) o Babies seen by [facility] providers o Accepting Medicaid o Must be a first-time baby or sibling of current patient . [facility] [address], [place], [place]  [address] o [phone]   Fax - [phone]  [place] [phone] & [phone]) . [facility] o [place], [place]; [place], [place]; Dimple Casey, MD; [place], [place]; Carolyne Fiscal, MD o [address] [place], [place] [address] o [phone] o Mon-Thur 8:00-7:00, Fri 8:00-5:00, Sat 8:00-12:00, Sun 9:00-12:00 o Babies seen by [facility] providers o Accepting Medicaid . [facility] at [facility], MD; Gaynell Face, MD; [facility], MD o [address], [place], [place] [address] o [phone] o Mon-Thur 8:00-5:00 o Babies seen by providers at [facility] o Accepting Medicaid . [facility] at [place] Gwenlyn Saran, MD; Coe[place], MD; Madilyn Fireman, MD; Melvyn Neth, MD; List, MD; Lazarus Salines, MD; Gaynell Face, MD; Berneda Rose, MD; Flora Lipps, MD; Beryl Meager, MD; Luther Redo, MD; Lavonia Drafts, MD; Kellie Simmering, MD o [address] [place], [place] [address] o [phone] o Mon-Fri 8:00-5:30, Sat (Oct.-Mar.) 9:00-1:00 o Babies seen by providers at [facility] o Accepting Medicaid o Must fill out new patient packet, available online at [email or web address] . [facility]) Simone Curiao Friddle,  NP; Tiburcio PeaHarris, NP; Tresa EndoKelly, NP; Whitney PostLogan, MD; [place], [place]; Hennie DuosPoth, MD; [place] Terrace[place]ad[place], MD; Kavin LeechStanton, NP o [address], [place], [place] [address] o [phone] o Mon-Thur 8:00-5:30, Fri 8:00-5:00 o Babies seen by providers at [facility] o Accepting [facility]  [place] [phone]) . [facility] o [place], [place]; [place], MD; Tanya NonesPickard, MD; [place], [place] o [address], [place], [place] [address] o [phone] o Mon-Fri 8:00-5:00 o Babies seen by providers at [facility] o Accepting [facility]   [place] [phone]) . [facility] o WallaceMasneri, DO; Lenise ArenaMeyers, MD; [place], [place] o [address], [place], [place] [address] o [phone] o Mon-Fri 8:00-5:00 o Babies seen by providers at [facility] o Does NOT accept Medicaid o Limited appointment availability, please call early in hospitalization  . Nature conservation officerLeBauer HealthCare at [facility] o Indian HillsKunedd, DO; [place] Falls[place]ow[place], MD o [address], [place], [place] [address] o [phone] o Mon-Fri 8:00-5:00 o Babies seen by [facility] providers o Does NOT accept Medicaid . [facility] Lorrine Kino Cameron, MD; Ninetta LightsMacDonald, MD; [place], [place]; GalesburgNayak, MD o [address], [place], [place] [address] o [phone] o Mon-Fri 8:00-5:00 o After hours clinic [facility]([address] [place], [place] [address]) [phone] Mon-Fri 5:00-8:00, Sat 12:00-6:00, Sun 10:00-4:00 o Babies seen by [facility] providers o Accepting Medicaid . [facility] o [address], [place], [place]  [address] o [phone]   Fax - [phone]  [place] [phone]) . Nature conservation officerLeBauer HealthCare at [facility] o Andy, MD o [address] [place], [place], [place] [address] o [phone] o Mon-Fri 8:00-5:00 o Babies seen by [facility] providers o Does NOT accept Medicaid . [facility] [place] [facility] [place]) Tomi Likenso Eksir, MD o [address], [place], [place] [address] o [phone] o Mon-Thur 8:00-7:00, Fri 8:00-5:00, Sat 8:00-12:00 o Babies seen by providers at [facility] o Accepting Medicaid - but does not have vaccinations in office (must be received elsewhere) o Limited availability, please call early in hospitalization  [place] ([phone]) . [facility]  o Wyvonne Lenzharlene Flemming, MD o [address], [place] [address] o [phone]  Fax [phone]

## 2020-05-06 NOTE — Progress Notes (Signed)
    Subjective:  Sara Jensen is a 24 y.o. G1P0 at [redacted]w[redacted]d being seen today for ongoing prenatal care.  She is currently monitored for the following issues for this low-risk pregnancy and has Supervision of low-risk pregnancy; Anxiety; Depression; and Echogenic intracardiac focus of fetus on prenatal ultrasound on their problem list.  Patient reports headache for several days.  Contractions: Not present.  .  Movement: Present. Denies leaking of fluid.   The following portions of the patient's history were reviewed and updated as appropriate: allergies, current medications, past family history, past medical history, past social history, past surgical history and problem list. Problem list updated.  Objective:   Vitals:   05/06/20 0827  BP: 120/72  Pulse: 91  Weight: 182 lb (82.6 kg)    Fetal Status: Fetal Heart Rate (bpm): 155 Fundal Height: 32 cm Movement: Present     General:  Alert, oriented and cooperative. Patient is in no acute distress.  Skin: Skin is warm and dry. No rash noted.   Cardiovascular: Normal heart rate noted  Respiratory: Normal respiratory effort, no problems with respiration noted  Abdomen: Soft, gravid, appropriate for gestational age. Pain/Pressure: Absent     Pelvic:  Cervical exam deferred        Extremities: Normal range of motion.  Edema: Trace  Mental Status: Normal mood and affect. Normal behavior. Normal judgment and thought content.   Urinalysis:      Assessment and Plan:  Pregnancy: G1P0 at [redacted]w[redacted]d  1. Encounter for supervision of low-risk pregnancy in third trimester Some pubic tenderness noted.  No problems with walking.  Encouraged stretching of lower back Watch fundal height - 32 at 30 weeks today Client very concerned about growth of the baby and position of the baby - wants an [place] at 36 weeks for position and size of baby - was breech at 19 weeks. Reviewed lab results. Encouraged to enroll in childbirth and breastfeeding classes - info in AVS  Reviewed selecting a pediatrician - list given Encouraged to take BP weekly.  Is not putting into babyscripts.  Reviewed 140/90 (either value) as too high.  2. Headache in pregnancy, antepartum Had one headache for 5 days off and on.  Took Tylenol on the last day - it did not relieve the headache but today she does not have a headache.   When she woke up today, it was gone.  Advised not to wait to take medication.  Also took allergy medication this week for nasal congestion which may have contributed to the headache.  No ankle edema. Reviewed fluid intake, eating protein at meals, no skipping meals and approp. Sleep which she states she is doing.  3. History of depression Will meet with BH as proactive visit as she had difficulty with depression and anxiety about 3 years ago.  Saw a psychiatrist at that time but did not ever take the prescribed medication.  - Ambulatory referral to Integrated Behavioral Health  Preterm labor symptoms and general obstetric precautions including but not limited to vaginal bleeding, contractions, leaking of fluid and fetal movement were reviewed in detail with the patient. Please refer to After Visit Summary for other counseling recommendations.  Return in about 2 weeks (around 05/20/2020) for in person ROB.  Nolene Bernheim, RN, MSN, NP-BC Nurse Practitioner, [facility] for [employer], [facility] 05/06/2020 9:03 AM

## 2020-05-20 ENCOUNTER — Encounter: Payer: Self-pay | Admitting: Advanced Practice Midwife

## 2020-05-20 ENCOUNTER — Other Ambulatory Visit: Payer: Self-pay

## 2020-05-20 ENCOUNTER — Ambulatory Visit (INDEPENDENT_AMBULATORY_CARE_PROVIDER_SITE_OTHER): Payer: 59 | Admitting: Advanced Practice Midwife

## 2020-05-20 VITALS — BP 131/76 | HR 96 | Wt 187.0 lb

## 2020-05-20 DIAGNOSIS — Z3493 Encounter for supervision of normal pregnancy, unspecified, third trimester: Secondary | ICD-10-CM

## 2020-05-20 DIAGNOSIS — Z3A32 32 weeks gestation of pregnancy: Secondary | ICD-10-CM

## 2020-05-20 NOTE — Progress Notes (Signed)
   PRENATAL VISIT NOTE  Subjective:  Sara Jensen is a 24 y.o. G1P0 at [redacted]w[redacted]d being seen today for ongoing prenatal care.  She is currently monitored for the following issues for this low-risk pregnancy and has Supervision of low-risk pregnancy; Anxiety; Depression; and Echogenic intracardiac focus of fetus on prenatal ultrasound on their problem list.  Patient reports no complaints.  Contractions: Not present. Vag. Bleeding: None.  Movement: Present. Denies leaking of fluid.   The following portions of the patient's history were reviewed and updated as appropriate: allergies, current medications, past family history, past medical history, past social history, past surgical history and problem list.   Objective:   Vitals:   05/20/20 0854  BP: 131/76  Pulse: 96  Weight: 187 lb (84.8 kg)    Fetal Status: Fetal Heart Rate (bpm): 140 Fundal Height: 34 cm Movement: Present     General:  Alert, oriented and cooperative. Patient is in no acute distress.  Skin: Skin is warm and dry. No rash noted.   Cardiovascular: Normal heart rate noted  Respiratory: Normal respiratory effort, no problems with respiration noted  Abdomen: Soft, gravid, appropriate for gestational age.  Pain/Pressure: Absent     Pelvic: Cervical exam deferred        Extremities: Normal range of motion.  Edema: None  Mental Status: Normal mood and affect. Normal behavior. Normal judgment and thought content.   Assessment and Plan:  Pregnancy: G1P0 at [redacted]w[redacted]d 1. Encounter for supervision of low-risk pregnancy in third trimester - routine care - GTT was normal   2. [redacted] weeks gestation of pregnancy - FH measuring ahead, last visit and this visit - If continues to measure ahead consider growth scan at next visit   Preterm labor symptoms and general obstetric precautions including but not limited to vaginal bleeding, contractions, leaking of fluid and fetal movement were reviewed in detail with the patient. Please refer to  After Visit Summary for other counseling recommendations.   Return in about 2 weeks (around 06/03/2020).  Future Appointments  Date Time Provider Department Center  06/04/2020  1:15 PM [facility]ALTH CLINICIAN [facility]    Thressa Sheller DNP, CNM  05/20/20  9:29 AM

## 2020-05-31 NOTE — L&D Delivery Note (Signed)
Delivery Note Sara Jensen is a 25 y.o. G1P0 at [redacted]w[redacted]d admitted for early labor, GHTN.   GBS Status:  Negative/-- (01/20 0912) Maximum Maternal Temperature: 99.6  Labor course: Initial SVE: 3/80/-1. Augmentation with: AROM and Pitocin. She then progressed to complete.  ROM: 10h 3m with clear fluid  Pushed x 3hrs, last hour w/o much progress. States she is exhausted. Cat 1 FHR. Gave options of VAVD vs continued pushing. Reviewed risks of cephalohematoma & potential shoulder dystocia. Pt requests VAVD. Dr. Jolayne Panther and Dr. Myriam Jacobson notified and came to room. NICU called to be in attendance of birth.  Kiwi vacuum applied with traction while pushing x 2 contractions by Dr. Myriam Jacobson w/o much descent of head. Dr. Jolayne Panther stepped in and within 2 more contractions vtx was brought to crowning position, and Dr. Myriam Jacobson stepped back in to complete delivery. No pop-offs.  Birth: At 2218 a viable female was delivered via vacuum, outlet (Presentation: ROP and restituted to LOA). Nuchal cord present: No.  Shoulders and body delivered in usual fashion. Infant placed directly on mom's abdomen for bonding/skin-to-skin, baby dried and stimulated. Cord clamped x 2 after 1 minute and cut by FOB.  Cord blood collected.  The placenta separated spontaneously and delivered via gentle cord traction.  Pitocin infused rapidly IV per protocol.  Fundus firm with massage.  Placenta inspected and appears to be intact with a 3 VC.  Placenta/Cord with the following complications: none .  Cord pH: not done Sponge and instrument count were correct x2.  Intrapartum complications:  Prolonged 2nd stage Anesthesia:  local and epidural Episiotomy: none Lacerations:  2nd degree Suture Repair: 3.0 vicryl repaired by Dr. Myriam Jacobson EBL (mL): 450   Infant: APGAR (1 MIN):  7 APGAR (5 MINS):  9 APGAR (10 MINS):  n/a Infant weight: pending  Mom to postpartum.  Baby to Couplet care / Skin to Skin. Placenta to Pathology for [facility]   Plans to  Breastfeed Contraception: oral progesterone-only contraceptive Circumcision: wants inpatient  Note sent to [facility]: [facility] for pp visit.  Cheral Marker CNM, [facility] 07/08/2020 10:28 PM

## 2020-06-04 ENCOUNTER — Ambulatory Visit (INDEPENDENT_AMBULATORY_CARE_PROVIDER_SITE_OTHER): Payer: 59 | Admitting: Certified Nurse Midwife

## 2020-06-04 ENCOUNTER — Other Ambulatory Visit: Payer: Self-pay

## 2020-06-04 VITALS — BP 124/71 | HR 99 | Wt 191.0 lb

## 2020-06-04 DIAGNOSIS — Z3493 Encounter for supervision of normal pregnancy, unspecified, third trimester: Secondary | ICD-10-CM

## 2020-06-04 DIAGNOSIS — Z3A34 34 weeks gestation of pregnancy: Secondary | ICD-10-CM

## 2020-06-04 NOTE — Patient Instructions (Signed)

## 2020-06-04 NOTE — Progress Notes (Signed)
   PRENATAL VISIT NOTE  Subjective:  Sara Jensen is a 25 y.o. G1P0 at [redacted]w[redacted]d being seen today for ongoing prenatal care.  She is currently monitored for the following issues for this low-risk pregnancy and has Supervision of low-risk pregnancy; Anxiety; Depression; and Echogenic intracardiac focus of fetus on prenatal ultrasound on their problem list.  Patient reports no complaints.  Contractions: Not present. Vag. Bleeding: None.  Movement: Present. Denies leaking of fluid.   The following portions of the patient's history were reviewed and updated as appropriate: allergies, current medications, past family history, past medical history, past social history, past surgical history and problem list.   Objective:   Vitals:   06/04/20 0935  BP: 124/71  Pulse: 99  Weight: 191 lb (86.6 kg)    Fetal Status: Fetal Heart Rate (bpm): 133 Fundal Height: 36 cm Movement: Present     General:  Alert, oriented and cooperative. Patient is in no acute distress.  Skin: Skin is warm and dry. No rash noted.   Cardiovascular: Normal heart rate noted  Respiratory: Normal respiratory effort, no problems with respiration noted  Abdomen: Soft, gravid, appropriate for gestational age.  Pain/Pressure: Absent     Pelvic: Cervical exam deferred        Extremities: Normal range of motion.  Edema: None  Mental Status: Normal mood and affect. Normal behavior. Normal judgment and thought content.   Assessment and Plan:  Pregnancy: G1P0 at [redacted]w[redacted]d 1. Encounter for supervision of low-risk pregnancy in third trimester - Doing well, feeling lots of movement and no contractions other than the occasional Braxton-Hicks  2. [redacted] weeks gestation of pregnancy - still measuring slightly ahead (36wks) today, so U/S ordered for assessment of size and positioning. - Reviewed kick counts and what to do if she discerns DFM - Anticipatory guidance about cadence of remaining visits  Preterm labor symptoms and general obstetric  precautions including but not limited to vaginal bleeding, contractions, leaking of fluid and fetal movement were reviewed in detail with the patient. Please refer to After Visit Summary for other counseling recommendations.   Return in about 2 weeks (around 06/18/2020) for IN-PERSON, LOB.  Future Appointments  Date Time Provider Department Center  06/11/2020  9:40 AM Armando Reichert, CNM [facility]  06/19/2020  8:55 AM Crisoforo Oxford, Charlesetta Garibaldi, CNM [facility]    Bernerd Limbo, CNM

## 2020-06-11 ENCOUNTER — Other Ambulatory Visit: Payer: Self-pay

## 2020-06-11 ENCOUNTER — Encounter: Payer: Self-pay | Admitting: Advanced Practice Midwife

## 2020-06-11 ENCOUNTER — Ambulatory Visit (INDEPENDENT_AMBULATORY_CARE_PROVIDER_SITE_OTHER): Payer: 59 | Admitting: Advanced Practice Midwife

## 2020-06-11 VITALS — BP 123/87 | HR 97 | Wt 194.4 lb

## 2020-06-11 DIAGNOSIS — Z3A35 35 weeks gestation of pregnancy: Secondary | ICD-10-CM

## 2020-06-11 DIAGNOSIS — Z3493 Encounter for supervision of normal pregnancy, unspecified, third trimester: Secondary | ICD-10-CM

## 2020-06-11 NOTE — Progress Notes (Signed)
   PRENATAL VISIT NOTE  Subjective:  Sara Jensen is a 25 y.o. G1P0 at [redacted]w[redacted]d being seen today for ongoing prenatal care.  She is currently monitored for the following issues for this low-risk pregnancy and has Supervision of low-risk pregnancy; Anxiety; Depression; and Echogenic intracardiac focus of fetus on prenatal ultrasound on their problem list.  Patient reports no complaints.  Contractions: Not present. Vag. Bleeding: None.  Movement: Present. Denies leaking of fluid.   The following portions of the patient's history were reviewed and updated as appropriate: allergies, current medications, past family history, past medical history, past social history, past surgical history and problem list.   Objective:   Vitals:   06/11/20 0940  BP: 123/87  Pulse: 97  Weight: 194 lb 6.4 oz (88.2 kg)    Fetal Status: Fetal Heart Rate (bpm): 150 Fundal Height: 37 cm Movement: Present     General:  Alert, oriented and cooperative. Patient is in no acute distress.  Skin: Skin is warm and dry. No rash noted.   Cardiovascular: Normal heart rate noted  Respiratory: Normal respiratory effort, no problems with respiration noted  Abdomen: Soft, gravid, appropriate for gestational age.  Pain/Pressure: Present     Pelvic: Cervical exam deferred        Extremities: Normal range of motion.  Edema: Trace  Mental Status: Normal mood and affect. Normal behavior. Normal judgment and thought content.   Assessment and Plan:  Pregnancy: G1P0 at [redacted]w[redacted]d 1. Encounter for supervision of low-risk pregnancy in third trimester - Measuring more on track today, but did not get [place] that was ordered. So will go ahead and get that scheduled.   2. [redacted] weeks gestation of pregnancy - GBS at next visit   Term labor symptoms and general obstetric precautions including but not limited to vaginal bleeding, contractions, leaking of fluid and fetal movement were reviewed in detail with the patient. Please refer to After Visit  Summary for other counseling recommendations.   Return in about 1 week (around 06/18/2020) for in person visit for GBS .  Future Appointments  Date Time Provider Department Center  06/19/2020  8:55 AM Marylene Land, CNM [facility]    Thressa Sheller DNP, CNM  06/11/20  10:09 AM

## 2020-06-19 ENCOUNTER — Other Ambulatory Visit (HOSPITAL_COMMUNITY)
Admission: RE | Admit: 2020-06-19 | Discharge: 2020-06-19 | Disposition: A | Discharge status: Home / Self Care | Payer: 59 | Source: Ambulatory Visit | Attending: Student | Admitting: Student

## 2020-06-19 ENCOUNTER — Other Ambulatory Visit: Payer: Self-pay

## 2020-06-19 ENCOUNTER — Ambulatory Visit (INDEPENDENT_AMBULATORY_CARE_PROVIDER_SITE_OTHER): Payer: 59 | Admitting: Student

## 2020-06-19 VITALS — BP 123/82 | HR 85 | Wt 196.6 lb

## 2020-06-19 DIAGNOSIS — Z3A37 37 weeks gestation of pregnancy: Secondary | ICD-10-CM

## 2020-06-19 DIAGNOSIS — Z3493 Encounter for supervision of normal pregnancy, unspecified, third trimester: Secondary | ICD-10-CM | POA: Diagnosis present

## 2020-06-19 NOTE — Progress Notes (Signed)
   PRENATAL VISIT NOTE  Subjective:  Sara Jensen is a 25 y.o. G1P0 at [redacted]w[redacted]d being seen today for ongoing prenatal care.  She is currently monitored for the following issues for this low-risk pregnancy and has Supervision of low-risk pregnancy; Anxiety; Depression; and Echogenic intracardiac focus of fetus on prenatal ultrasound on their problem list.  Patient reports no complaints.  Contractions: Not present. Vag. Bleeding: None.  Movement: Present. Denies leaking of fluid.   The following portions of the patient's history were reviewed and updated as appropriate: allergies, current medications, past family history, past medical history, past social history, past surgical history and problem list.   Objective:   Vitals:   06/19/20 0908  BP: 123/82  Pulse: 85  Weight: 196 lb 9.6 oz (89.2 kg)    Fetal Status: Fetal Heart Rate (bpm): 154 Fundal Height: 35 cm Movement: Present     General:  Alert, oriented and cooperative. Patient is in no acute distress.  Skin: Skin is warm and dry. No rash noted.   Cardiovascular: Normal heart rate noted  Respiratory: Normal respiratory effort, no problems with respiration noted  Abdomen: Soft, gravid, appropriate for gestational age.  Pain/Pressure: Absent     Pelvic: Cervical exam deferred        Extremities: Normal range of motion.  Edema: None  Mental Status: Normal mood and affect. Normal behavior. Normal judgment and thought content.   Assessment and Plan:  Pregnancy: G1P0 at [redacted]w[redacted]d 1. Encounter for supervision of low-risk pregnancy in third trimester -Patient reports that she feels baby has dropped; she reports strong movements.  - GC/Chlamydia probe amp ([place])not at [facility] - Culture, beta strep (group b only) -will get COVID booster asap -does not want IUD, is considering the patch or the pill -Vertex by [place] -Keep [place] appt next week -Discussed signs and symptoms of labor and when to come to [facility]U  Term labor symptoms and general  obstetric precautions including but not limited to vaginal bleeding, contractions, leaking of fluid and fetal movement were reviewed in detail with the patient. Please refer to After Visit Summary for other counseling recommendations.   No follow-ups on file.  Future Appointments  Date Time Provider Department Center  06/27/2020  8:35 AM Osborne Oman [facility]  06/27/2020  9:45 AM [facility] US5 [facility]  07/04/2020  9:55 AM Kathlene Cote [facility]  07/11/2020  9:35 AM Crisoforo Oxford, Charlesetta Garibaldi, CNM [facility]    Marylene Land, CNM

## 2020-06-19 NOTE — Patient Instructions (Signed)

## 2020-06-20 LAB — GC/CHLAMYDIA PROBE AMP (~~LOC~~) NOT AT ARMC
Chlamydia: NEGATIVE
Comment: NEGATIVE
Comment: NORMAL
Neisseria Gonorrhea: NEGATIVE

## 2020-06-23 LAB — CULTURE, BETA STREP (GROUP B ONLY): Strep Gp B Culture: NEGATIVE

## 2020-06-27 ENCOUNTER — Other Ambulatory Visit: Payer: Self-pay

## 2020-06-27 ENCOUNTER — Ambulatory Visit: Payer: 59 | Attending: Certified Nurse Midwife

## 2020-06-27 ENCOUNTER — Ambulatory Visit (INDEPENDENT_AMBULATORY_CARE_PROVIDER_SITE_OTHER): Payer: 59 | Admitting: Certified Nurse Midwife

## 2020-06-27 VITALS — BP 131/86 | HR 85 | Wt 199.3 lb

## 2020-06-27 DIAGNOSIS — Z3493 Encounter for supervision of normal pregnancy, unspecified, third trimester: Secondary | ICD-10-CM | POA: Diagnosis present

## 2020-06-27 DIAGNOSIS — Z3483 Encounter for supervision of other normal pregnancy, third trimester: Secondary | ICD-10-CM

## 2020-06-27 DIAGNOSIS — Z3A38 38 weeks gestation of pregnancy: Secondary | ICD-10-CM

## 2020-06-27 NOTE — Progress Notes (Signed)
   PRENATAL VISIT NOTE  Subjective:  Sara Jensen is a 25 y.o. G1P0 at [redacted]w[redacted]d being seen today for ongoing prenatal care.  She is currently monitored for the following issues for this low-risk pregnancy and has Supervision of low-risk pregnancy; Anxiety; Depression; and Echogenic intracardiac focus of fetus on prenatal ultrasound on their problem list.  Patient reports no complaints.  Contractions: Not present. Vag. Bleeding: None.  Movement: Present. Denies leaking of fluid.   The following portions of the patient's history were reviewed and updated as appropriate: allergies, current medications, past family history, past medical history, past social history, past surgical history and problem list.   Objective:   Vitals:   06/27/20 0841  BP: 131/86  Pulse: 85  Weight: 199 lb 4.8 oz (90.4 kg)    Fetal Status: Fetal Heart Rate (bpm): 141 Fundal Height: 38 cm Movement: Present     General:  Alert, oriented and cooperative. Patient is in no acute distress.  Skin: Skin is warm and dry. No rash noted.   Cardiovascular: Normal heart rate noted  Respiratory: Normal respiratory effort, no problems with respiration noted  Abdomen: Soft, gravid, appropriate for gestational age.  Pain/Pressure: Absent     Pelvic: Cervical exam deferred        Extremities: Normal range of motion.  Edema: Trace  Mental Status: Normal mood and affect. Normal behavior. Normal judgment and thought content.   Assessment and Plan:  Pregnancy: G1P0 at [redacted]w[redacted]d 1. Encounter for supervision of other normal pregnancy in third trimester - Doing well, no complaints and feeling frequent fetal movement  2. [redacted] weeks gestation of pregnancy - Routine OB care - Follow up U/S today - Will discuss scheduling of post-term IOL at next appointment  Term labor symptoms and general obstetric precautions including but not limited to vaginal bleeding, contractions, leaking of fluid and fetal movement were reviewed in detail with the  patient. Please refer to After Visit Summary for other counseling recommendations.   Return in about 1 week (around 07/04/2020) for IN-PERSON, LOB.  Future Appointments  Date Time Provider Department Center  07/04/2020  9:55 AM Kathlene Cote [facility]  07/11/2020  9:35 AM Crisoforo Oxford, Charlesetta Garibaldi, CNM [facility]   Edd Arbour, CNM, MSN, [facility] Certified Nurse Midwife, [facility]

## 2020-07-04 ENCOUNTER — Other Ambulatory Visit: Payer: Self-pay

## 2020-07-04 ENCOUNTER — Encounter (HOSPITAL_COMMUNITY): Payer: Self-pay | Admitting: *Deleted

## 2020-07-04 ENCOUNTER — Ambulatory Visit (INDEPENDENT_AMBULATORY_CARE_PROVIDER_SITE_OTHER): Payer: 59 | Admitting: Medical

## 2020-07-04 ENCOUNTER — Telehealth (HOSPITAL_COMMUNITY): Payer: Self-pay | Admitting: *Deleted

## 2020-07-04 ENCOUNTER — Encounter: Payer: Self-pay | Admitting: Medical

## 2020-07-04 VITALS — BP 128/86 | HR 92 | Wt 202.9 lb

## 2020-07-04 DIAGNOSIS — Z3493 Encounter for supervision of normal pregnancy, unspecified, third trimester: Secondary | ICD-10-CM

## 2020-07-04 DIAGNOSIS — O283 Abnormal ultrasonic finding on antenatal screening of mother: Secondary | ICD-10-CM

## 2020-07-04 DIAGNOSIS — Z3A39 39 weeks gestation of pregnancy: Secondary | ICD-10-CM

## 2020-07-04 NOTE — Progress Notes (Signed)
   PRENATAL VISIT NOTE  Subjective:  Sara Jensen is a 25 y.o. G1P0 at [redacted]w[redacted]d being seen today for ongoing prenatal care.  She is currently monitored for the following issues for this low-risk pregnancy and has Supervision of low-risk pregnancy; Anxiety; Depression; and Echogenic intracardiac focus of fetus on prenatal ultrasound on their problem list.  Patient reports no complaints.  Contractions: Not present. Vag. Bleeding: None.  Movement: Present. Denies leaking of fluid.   The following portions of the patient's history were reviewed and updated as appropriate: allergies, current medications, past family history, past medical history, past social history, past surgical history and problem list.   Objective:   Vitals:   07/04/20 0956  BP: 128/86  Pulse: 92  Weight: 202 lb 14.4 oz (92 kg)    Fetal Status: Fetal Heart Rate (bpm): 146   Movement: Present  Presentation: Vertex  General:  Alert, oriented and cooperative. Patient is in no acute distress.  Skin: Skin is warm and dry. No rash noted.   Cardiovascular: Normal heart rate noted  Respiratory: Normal respiratory effort, no problems with respiration noted  Abdomen: Soft, gravid, appropriate for gestational age.  Pain/Pressure: Absent     Pelvic: Cervical exam performed in the presence of a chaperone Dilation: 1 Effacement (%): 50 Station: -2  Extremities: Normal range of motion.  Edema: None  Mental Status: Normal mood and affect. Normal behavior. Normal judgment and thought content.   Assessment and Plan:  Pregnancy: G1P0 at [redacted]w[redacted]d 1. Encounter for supervision of low-risk pregnancy in third trimester - Schedule IOL for post dates, discussed with patient and would like closer to 40 weeks than waiting until 41 weeks - Given cervical exam today, I am comfortable with scheduling on 07/11/20 - IOL orders placed and patient is aware to wait for a call for IOL time morning of IOL - GBS Negative discussed  - Planning POPs for MOC -  Growth [place] from 1/28 discussed, performed due to size> dates on fundal height, EFW 46%  2. Echogenic intracardiac focus of fetus on prenatal ultrasound - Resolved on last [place]   3. [redacted] weeks gestation of pregnancy  Term labor symptoms and general obstetric precautions including but not limited to vaginal bleeding, contractions, leaking of fluid and fetal movement were reviewed in detail with the patient. Please refer to After Visit Summary for other counseling recommendations.   Return in about 6 weeks (around 08/15/2020) for PP visit, In-Person.  Future Appointments  Date Time Provider Department Center  07/11/2020  7:40 AM [facility] SCHED ROOM [facility] None    Vonzella Nipple, PA-C

## 2020-07-04 NOTE — Progress Notes (Signed)
IOL on 07/11/20 in AM.

## 2020-07-04 NOTE — Patient Instructions (Signed)
Fetal Movement Counts Patient Name: ________________________________________________ Patient Due Date: ____________________  What is a fetal movement count? A fetal movement count is the number of times that you feel your baby move during a certain amount of time. This may also be called a fetal kick count. A fetal movement count is recommended for every pregnant woman. You may be asked to start counting fetal movements as early as week 28 of your pregnancy. Pay attention to when your baby is most active. You may notice your baby's sleep and wake cycles. You may also notice things that make your baby move more. You should do a fetal movement count:  When your baby is normally most active.  At the same time each day. A good time to count movements is while you are resting, after having something to eat and drink. How do I count fetal movements? 1. Find a quiet, comfortable area. Sit, or lie down on your side. 2. Write down the date, the start time and stop time, and the number of movements that you felt between those two times. Take this information with you to your health care visits. 3. Write down your start time when you feel the first movement. 4. Count kicks, flutters, swishes, rolls, and jabs. You should feel at least 10 movements. 5. You may stop counting after you have felt 10 movements, or if you have been counting for 2 hours. Write down the stop time. 6. If you do not feel 10 movements in 2 hours, contact your health care provider for further instructions. Your health care provider may want to do additional tests to assess your baby's well-being. Contact a health care provider if:  You feel fewer than 10 movements in 2 hours.  Your baby is not moving like he or she usually does. Date: ____________ Start time: ____________ Stop time: ____________ Movements: ____________ Date: ____________ Start time: ____________ Stop time: ____________ Movements: ____________ Date: ____________  Start time: ____________ Stop time: ____________ Movements: ____________ Date: ____________ Start time: ____________ Stop time: ____________ Movements: ____________ Date: ____________ Start time: ____________ Stop time: ____________ Movements: ____________ Date: ____________ Start time: ____________ Stop time: ____________ Movements: ____________ Date: ____________ Start time: ____________ Stop time: ____________ Movements: ____________ Date: ____________ Start time: ____________ Stop time: ____________ Movements: ____________ Date: ____________ Start time: ____________ Stop time: ____________ Movements: ____________ This information is not intended to replace advice given to you by your health care provider. Make sure you discuss any questions you have with your health care provider. Document Revised: 01/04/2019 Document Reviewed: 01/04/2019 Elsevier Patient Education  2021 Elsevier Inc. Rosen's Emergency Medicine: Concepts and Clinical Practice (9th ed., pp. 2296- 2312). Elsevier.">    Braxton Hicks Contractions Contractions of the uterus can occur throughout pregnancy, but they are not always a sign that you are in labor. You may have practice contractions called Braxton Hicks contractions. These false labor contractions are sometimes confused with true labor. What are Braxton Hicks contractions? Braxton Hicks contractions are tightening movements that occur in the muscles of the uterus before labor. Unlike true labor contractions, these contractions do not result in opening (dilation) and thinning of the cervix. Toward the end of pregnancy (32-34 weeks), Braxton Hicks contractions can happen more often and may become stronger. These contractions are sometimes difficult to tell apart from true labor because they can be very uncomfortable. You should not feel embarrassed if you go to the hospital with false labor. Sometimes, the only way to tell if you are in true labor is   for your health care  provider to look for changes in the cervix. The health care provider will do a physical exam and may monitor your contractions. If you are not in true labor, the exam should show that your cervix is not dilating and your water has not broken. If there are no other health problems associated with your pregnancy, it is completely safe for you to be sent home with false labor. You may continue to have Braxton Hicks contractions until you go into true labor. How to tell the difference between true labor and false labor True labor  Contractions last 30-70 seconds.  Contractions become very regular.  Discomfort is usually felt in the top of the uterus, and it spreads to the lower abdomen and low back.  Contractions do not go away with walking.  Contractions usually become more intense and increase in frequency.  The cervix dilates and gets thinner. False labor  Contractions are usually shorter and not as strong as true labor contractions.  Contractions are usually irregular.  Contractions are often felt in the front of the lower abdomen and in the groin.  Contractions may go away when you walk around or change positions while lying down.  Contractions get weaker and are shorter-lasting as time goes on.  The cervix usually does not dilate or become thin. Follow these instructions at home:  Take over-the-counter and prescription medicines only as told by your health care provider.  Keep up with your usual exercises and follow other instructions from your health care provider.  Eat and drink lightly if you think you are going into labor.  If Braxton Hicks contractions are making you uncomfortable: ? Change your position from lying down or resting to walking, or change from walking to resting. ? Sit and rest in a tub of warm water. ? Drink enough fluid to keep your urine pale yellow. Dehydration may cause these contractions. ? Do slow and deep breathing several times an hour.  Keep  all follow-up prenatal visits as told by your health care provider. This is important.   Contact a health care provider if:  You have a fever.  You have continuous pain in your abdomen. Get help right away if:  Your contractions become stronger, more regular, and closer together.  You have fluid leaking or gushing from your vagina.  You pass blood-tinged mucus (bloody show).  You have bleeding from your vagina.  You have low back pain that you never had before.  You feel your baby's head pushing down and causing pelvic pressure.  Your baby is not moving inside you as much as it used to. Summary  Contractions that occur before labor are called Braxton Hicks contractions, false labor, or practice contractions.  Braxton Hicks contractions are usually shorter, weaker, farther apart, and less regular than true labor contractions. True labor contractions usually become progressively stronger and regular, and they become more frequent.  Manage discomfort from Braxton Hicks contractions by changing position, resting in a warm bath, drinking plenty of water, or practicing deep breathing. This information is not intended to replace advice given to you by your health care provider. Make sure you discuss any questions you have with your health care provider. Document Revised: 04/29/2017 Document Reviewed: 09/30/2016 Elsevier Patient Education  2021 Elsevier Inc.  

## 2020-07-04 NOTE — Telephone Encounter (Signed)
Preadmission screen  

## 2020-07-06 ENCOUNTER — Other Ambulatory Visit: Payer: Self-pay | Admitting: Advanced Practice Midwife

## 2020-07-08 ENCOUNTER — Encounter (HOSPITAL_COMMUNITY): Payer: Self-pay | Admitting: Obstetrics & Gynecology

## 2020-07-08 ENCOUNTER — Inpatient Hospital Stay (HOSPITAL_COMMUNITY): Payer: 59 | Admitting: Anesthesiology

## 2020-07-08 ENCOUNTER — Inpatient Hospital Stay (HOSPITAL_COMMUNITY)
Admission: AD | Admit: 2020-07-08 | Discharge: 2020-07-10 | DRG: 807 | Disposition: A | Discharge status: Home / Self Care | Payer: 59 | Attending: Obstetrics and Gynecology | Admitting: Obstetrics and Gynecology

## 2020-07-08 ENCOUNTER — Other Ambulatory Visit: Payer: Self-pay

## 2020-07-08 DIAGNOSIS — O283 Abnormal ultrasonic finding on antenatal screening of mother: Secondary | ICD-10-CM

## 2020-07-08 DIAGNOSIS — O9902 Anemia complicating childbirth: Secondary | ICD-10-CM | POA: Diagnosis present

## 2020-07-08 DIAGNOSIS — Z20822 Contact with and (suspected) exposure to covid-19: Secondary | ICD-10-CM | POA: Diagnosis present

## 2020-07-08 DIAGNOSIS — O134 Gestational [pregnancy-induced] hypertension without significant proteinuria, complicating childbirth: Secondary | ICD-10-CM | POA: Diagnosis present

## 2020-07-08 DIAGNOSIS — D649 Anemia, unspecified: Secondary | ICD-10-CM | POA: Diagnosis present

## 2020-07-08 DIAGNOSIS — Z3A39 39 weeks gestation of pregnancy: Secondary | ICD-10-CM | POA: Diagnosis not present

## 2020-07-08 DIAGNOSIS — F419 Anxiety disorder, unspecified: Secondary | ICD-10-CM

## 2020-07-08 DIAGNOSIS — O480 Post-term pregnancy: Principal | ICD-10-CM | POA: Diagnosis present

## 2020-07-08 LAB — COMPREHENSIVE METABOLIC PANEL
ALT: 15 U/L (ref 0–44)
AST: 16 U/L (ref 15–41)
Albumin: 3 g/dL — ABNORMAL LOW (ref 3.5–5.0)
Alkaline Phosphatase: 157 U/L — ABNORMAL HIGH (ref 38–126)
Anion gap: 12 (ref 5–15)
BUN: 7 mg/dL (ref 6–20)
CO2: 21 mmol/L — ABNORMAL LOW (ref 22–32)
Calcium: 9.1 mg/dL (ref 8.9–10.3)
Chloride: 102 mmol/L (ref 98–111)
Creatinine, Ser: 0.61 mg/dL (ref 0.44–1.00)
GFR, Estimated: >60 mL/min (ref >60)
Glucose, Bld: 108 mg/dL — ABNORMAL HIGH (ref 70–99)
Potassium: 3.5 mmol/L (ref 3.5–5.1)
Sodium: 135 mmol/L (ref 135–145)
Total Bilirubin: 0.1 mg/dL — ABNORMAL LOW (ref 0.3–1.2)
Total Protein: 6.7 g/dL (ref 6.5–8.1)

## 2020-07-08 LAB — URINALYSIS, MICROSCOPIC (REFLEX): Bacteria, UA: NONE SEEN

## 2020-07-08 LAB — CBC
HCT: 33.1 % — ABNORMAL LOW (ref 36.0–46.0)
HCT: 34 % — ABNORMAL LOW (ref 36.0–46.0)
Hemoglobin: 10.9 g/dL — ABNORMAL LOW (ref 12.0–15.0)
Hemoglobin: 10.6 g/dL — ABNORMAL LOW (ref 12.0–15.0)
MCH: 28.8 pg (ref 26.0–34.0)
MCH: 27.7 pg (ref 26.0–34.0)
MCHC: 32.9 g/dL (ref 30.0–36.0)
MCHC: 31.2 g/dL (ref 30.0–36.0)
MCV: 87.6 fL (ref 80.0–100.0)
MCV: 88.8 fL (ref 80.0–100.0)
Platelets: 236 10*3/uL (ref 150–400)
Platelets: 237 10*3/uL (ref 150–400)
RBC: 3.78 MIL/uL — ABNORMAL LOW (ref 3.87–5.11)
RBC: 3.83 MIL/uL — ABNORMAL LOW (ref 3.87–5.11)
RDW: 13.9 % (ref 11.5–15.5)
RDW: 13.8 % (ref 11.5–15.5)
WBC: 14.3 10*3/uL — ABNORMAL HIGH (ref 4.0–10.5)
WBC: 14.7 10*3/uL — ABNORMAL HIGH (ref 4.0–10.5)
nRBC: 0 % (ref 0.0–0.2)
nRBC: 0 % (ref 0.0–0.2)

## 2020-07-08 LAB — URINALYSIS, ROUTINE W REFLEX MICROSCOPIC
Bilirubin Urine: NEGATIVE
Glucose, UA: NEGATIVE mg/dL
Ketones, ur: NEGATIVE mg/dL
Leukocytes,Ua: NEGATIVE
Nitrite: NEGATIVE
Protein, ur: NEGATIVE mg/dL
Specific Gravity, Urine: 1.015 (ref 1.005–1.030)
pH: 7.5 (ref 5.0–8.0)

## 2020-07-08 LAB — PROTEIN / CREATININE RATIO, URINE
Creatinine, Urine: 55.13 mg/dL
Protein Creatinine Ratio: 0.18 mg/mg{Cre} — ABNORMAL HIGH (ref 0.00–0.15)
Total Protein, Urine: 10 mg/dL

## 2020-07-08 LAB — TYPE AND SCREEN
ABO/RH(D): A POS
Antibody Screen: NEGATIVE

## 2020-07-08 LAB — SARS CORONAVIRUS 2 BY RT PCR (HOSPITAL ORDER, PERFORMED IN ~~LOC~~ HOSPITAL LAB): SARS Coronavirus 2: NEGATIVE

## 2020-07-08 LAB — RPR: RPR Ser Ql: NONREACTIVE

## 2020-07-08 MED ORDER — OXYTOCIN-SODIUM CHLORIDE 30-0.9 UT/500ML-% IV SOLN
2.5000 [IU]/h | INTRAVENOUS | Status: DC
  Administered 2020-07-08: 2.5 [IU]/h via INTRAVENOUS

## 2020-07-08 MED ORDER — TERBUTALINE SULFATE 1 MG/ML IJ SOLN: 0.2500 mg | Freq: Once | INTRAMUSCULAR | Status: DC | PRN

## 2020-07-08 MED ORDER — LACTATED RINGERS IV SOLN: 500.0000 mL | Freq: Once | INTRAVENOUS | Status: DC

## 2020-07-08 MED ORDER — OXYTOCIN BOLUS FROM INFUSION
333.0000 mL | Freq: Once | INTRAVENOUS | Status: AC
  Administered 2020-07-08: 333 mL via INTRAVENOUS

## 2020-07-08 MED ORDER — DIPHENHYDRAMINE HCL 50 MG/ML IJ SOLN: 12.5000 mg | INTRAMUSCULAR | Status: DC | PRN

## 2020-07-08 MED ORDER — ACETAMINOPHEN 325 MG PO TABS: 650.0000 mg | ORAL_TABLET | ORAL | Status: DC | PRN

## 2020-07-08 MED ORDER — EPHEDRINE 5 MG/ML INJ: 10.0000 mg | INTRAVENOUS | Status: DC | PRN

## 2020-07-08 MED ORDER — OXYTOCIN-SODIUM CHLORIDE 30-0.9 UT/500ML-% IV SOLN
1.0000 m[IU]/min | INTRAVENOUS | Status: DC
  Administered 2020-07-08: 2 m[IU]/min via INTRAVENOUS
  Filled 2020-07-08: qty 500

## 2020-07-08 MED ORDER — LACTATED RINGERS IV SOLN: 500.0000 mL | INTRAVENOUS | Status: DC | PRN

## 2020-07-08 MED ORDER — OXYCODONE-ACETAMINOPHEN 5-325 MG PO TABS: 2.0000 | ORAL_TABLET | ORAL | Status: DC | PRN

## 2020-07-08 MED ORDER — LIDOCAINE HCL (PF) 1 % IJ SOLN
30.0000 mL | INTRAMUSCULAR | Status: AC | PRN
  Administered 2020-07-08: 30 mL via SUBCUTANEOUS
  Filled 2020-07-08: qty 30

## 2020-07-08 MED ORDER — OXYCODONE-ACETAMINOPHEN 5-325 MG PO TABS: 1.0000 | ORAL_TABLET | ORAL | Status: DC | PRN

## 2020-07-08 MED ORDER — FENTANYL-BUPIVACAINE-NACL 0.5-0.125-0.9 MG/250ML-% EP SOLN
12.0000 mL/h | EPIDURAL | Status: DC | PRN
Start: 2020-07-08 — End: 2020-07-09
  Filled 2020-07-08: qty 250

## 2020-07-08 MED ORDER — PHENYLEPHRINE 40 MCG/ML (10ML) SYRINGE FOR IV PUSH (FOR BLOOD PRESSURE SUPPORT): 80.0000 ug | PREFILLED_SYRINGE | INTRAVENOUS | Status: DC | PRN

## 2020-07-08 MED ORDER — LACTATED RINGERS IV SOLN: INTRAVENOUS | Status: DC

## 2020-07-08 MED ORDER — SOD CITRATE-CITRIC ACID 500-334 MG/5ML PO SOLN: 30.0000 mL | ORAL | Status: DC | PRN

## 2020-07-08 MED ORDER — MISOPROSTOL 25 MCG QUARTER TABLET
25.0000 ug | ORAL_TABLET | ORAL | Status: DC | PRN
Start: 2020-07-08 — End: 2020-07-09

## 2020-07-08 MED ORDER — ONDANSETRON HCL 4 MG/2ML IJ SOLN
4.0000 mg | Freq: Four times a day (QID) | INTRAMUSCULAR | Status: DC | PRN
  Administered 2020-07-08 (×2): 4 mg via INTRAVENOUS
  Filled 2020-07-08 (×2): qty 2

## 2020-07-08 MED ORDER — FENTANYL CITRATE (PF) 100 MCG/2ML IJ SOLN
50.0000 ug | INTRAMUSCULAR | Status: DC | PRN
  Administered 2020-07-08 (×2): 100 ug via INTRAVENOUS
  Filled 2020-07-08 (×2): qty 2

## 2020-07-08 MED ORDER — LIDOCAINE HCL (PF) 1 % IJ SOLN
INTRAMUSCULAR | Status: DC | PRN
  Administered 2020-07-08 (×2): 4 mL via EPIDURAL

## 2020-07-08 MED ORDER — FENTANYL-BUPIVACAINE-NACL 0.5-0.125-0.9 MG/250ML-% EP SOLN
EPIDURAL | Status: DC | PRN
  Administered 2020-07-08: 12 mL/h via EPIDURAL

## 2020-07-08 NOTE — Progress Notes (Signed)
Labor Progress Note Sara Jensen is a 24 y.o. G1P0 at [redacted]w[redacted]d presented for SOL and new onset gHTN. S: Patient has been pushing for about 2 hours. She endorses feeling nauseas and tired, but is wanting to continue pushing on each of her contractions.   O:  BP 135/86   Pulse (!) 101   Temp 99.6 F (37.6 C) (Axillary)   Resp 20   Ht 5\' 2"  (1.575 m)   Wt 93.9 kg   LMP 09/23/2019 (Within Weeks)   SpO2 100%   BMI 37.86 kg/m  EFM: baseline HR 130 bpm/mod variability/accels present, no decels   CVE: Dilation: 10 Dilation Complete Date: 07/08/20 Dilation Complete Time: 1855 Effacement (%): 100 Cervical Position: Middle Station: Plus 2 Presentation: Vertex Exam by:: [email or web address] RN   A&P: 25 y.o. G1P0 [redacted]w[redacted]d presented for SOL and new onset gHTN. #Labor: S/p AROM at 1400 and Pit titration. Patient has been pushing since about 1900. Will continue pushing and in about an hour can present the option of a vacuum assisted delivery  #Pain: Epidural in place  #FWB: Cat 1 strip #GBS negative #gHTN: elevated BPs today (121/7.-150/97). Normal PreE lab. Asymptomatic. Still no severe range #Anxiety&depression: SW consult  #Echogenic intracardiac focus of fetus: resolved on last [redacted]w[redacted]d  [place], DO [facility], [facility] 9:06 PM

## 2020-07-08 NOTE — MAU Note (Signed)
Contractions x 2 hours, denies bleeding or ROM. Reports good fetal movment.

## 2020-07-08 NOTE — Anesthesia Procedure Notes (Signed)
Epidural Patient location during procedure: OB Start time: 07/08/2020 9:30 AM End time: 07/08/2020 9:33 AM  Staffing Anesthesiologist: Kaylyn Layer, MD Performed: anesthesiologist   Preanesthetic Checklist Completed: patient identified, IV checked, risks and benefits discussed, monitors and equipment checked, pre-op evaluation and timeout performed  Epidural Patient position: sitting Prep: DuraPrep and site prepped and draped Patient monitoring: continuous pulse ox, blood pressure and heart rate Approach: midline Location: L3-L4 Injection technique: LOR air  Needle:  Needle type: Tuohy  Needle gauge: 17 G Needle length: 9 cm Catheter type: closed end flexible Catheter size: 19 Gauge Catheter at skin depth: 13 cm Test dose: negative and Other (1% lidocaine)  Assessment Events: blood not aspirated, injection not painful, no injection resistance, no paresthesia and negative IV test  Additional Notes Patient identified. Risks, benefits, and alternatives discussed with patient including but not limited to bleeding, infection, nerve damage, paralysis, failed block, incomplete pain control, headache, blood pressure changes, nausea, vomiting, reactions to medication, itching, and postpartum back pain. Confirmed with bedside nurse the patient's most recent platelet count. Confirmed with patient that they are not currently taking any anticoagulation, have any bleeding history, or any family history of bleeding disorders. Patient expressed understanding and wished to proceed. All questions were answered. Sterile technique was used throughout the entire procedure. Please see nursing notes for vital signs.   Crisp LOR after 2 needle redirections. Test dose was given through epidural catheter and negative prior to continuing to dose epidural or start infusion. Warning signs of high block given to the patient including shortness of breath, tingling/numbness in hands, complete motor block, or any  concerning symptoms with instructions to call for help. Patient was given instructions on fall risk and not to get out of bed. All questions and concerns addressed with instructions to call with any issues or inadequate analgesia.  Reason for block:procedure for pain

## 2020-07-08 NOTE — Lactation Note (Signed)
This note was copied from a baby's chart. Lactation Consultation Note  Patient Name: Boy Teresia Myint SRPRX'Y Date: 07/08/2020 Reason for consult: L&D Initial assessment;1st time breastfeeding;Term Age:24 hours  (LC services- no charge) LC entered room, congratulated parents on infant's birth.  Mom is active on [facility] and has Personal DEBP at home.  Mom  had recently finished BF infant for 15 minutes in L&D. LC assisted dad with doing STS with infant. LC discussed hand expression and mom taught back, infant was given 3 mls of colostrum by spoon. Mom knows to BF infant according to primal cues: licking, kissing, smacking, hands in mouth and rooting. Mom knows to ask RN or LC for latch assistance if needed in [place].  Mom has no questions or concerns for LC at this time. Mom will receive LC services brochure in her room on [facility].  Maternal Data Has patient been taught Hand Expression?: Yes Does the patient have breastfeeding experience prior to this delivery?: No  Feeding Mother's Current Feeding Choice: Breast Milk  LATCH Score              Lactation Tools Discussed/Used    Interventions Interventions: Breast feeding basics reviewed;Skin to skin;Expressed milk;Hand express  Discharge Pump: Personal WIC Program: Yes  Consult Status Consult Status: Follow-up Date: 07/09/20 Follow-up type: In-patient    Danelle Earthly 07/08/2020, 11:28 PM

## 2020-07-08 NOTE — Anesthesia Preprocedure Evaluation (Addendum)
Anesthesia Evaluation    Reviewed: Allergy & Precautions, Patient's Chart, lab work & pertinent test results  History of Anesthesia Complications Negative for: history of anesthetic complications  Airway Mallampati: II  TM Distance: >3 FB Neck ROM: Full    Dental no notable dental hx.    Pulmonary neg pulmonary ROS,    Pulmonary exam normal        Cardiovascular hypertension, Normal cardiovascular exam     Neuro/Psych Anxiety Depression negative neurological ROS     GI/Hepatic negative GI ROS, Neg liver ROS,   Endo/Other  negative endocrine ROS  Renal/GU negative Renal ROS  negative genitourinary   Musculoskeletal negative musculoskeletal ROS (+)   Abdominal (+) + obese,   Peds  Hematology negative hematology ROS (+)   Anesthesia Other Findings Day of surgery medications reviewed with patient.  Reproductive/Obstetrics (+) Pregnancy                            Anesthesia Physical Anesthesia Plan  ASA: II  Anesthesia Plan: Epidural   Post-op Pain Management:    Induction:   PONV Risk Score and Plan: Treatment may vary due to age or medical condition  Airway Management Planned:   Additional Equipment:   Intra-op Plan:   Post-operative Plan:   Informed Consent: I have reviewed the patients History and Physical, chart, labs and discussed the procedure including the risks, benefits and alternatives for the proposed anesthesia with the patient or authorized representative who has indicated his/her understanding and acceptance.       Plan Discussed with: CRNA  Anesthesia Plan Comments:        Anesthesia Quick Evaluation

## 2020-07-08 NOTE — Discharge Summary (Addendum)
Postpartum Discharge Summary     Patient Name: Sara Jensen DOB: Jun 09, 1995 MRN: [id number]  Date of admission: 07/08/2020 Delivery date:07/08/2020  Delivering provider: Janet Berlin  Date of discharge: 07/10/2020  Admitting diagnosis: Post-dates pregnancy [O48.0] Intrauterine pregnancy: [redacted]w[redacted]d    Secondary diagnosis:  Active Problems:   Post-dates pregnancy  Additional problems: GHTN dx on admission    Discharge diagnosis: Term Pregnancy Delivered and Gestational Hypertension                                              Post partum procedures:None Augmentation: AROM and Pitocin Complications: None  Hospital course: Onset of Labor With Vaginal Delivery      25y.o. yo G1P0 at 389w5das admitted in Latent Labor on 07/08/2020. Patient had an uncomplicated labor course as follows:  Membrane Rupture Time/Date: 11:57 AM ,07/08/2020   Delivery Method:Vaginal, Vacuum (Extractor)  Episiotomy: None  Lacerations:  2nd degree  Patient had an uncomplicated postpartum course.  She is ambulating, tolerating a regular diet, passing flatus, and urinating well. Patient is discharged home in stable condition on 07/08/20.  Newborn Data: Birth date:07/08/2020  Birth time:10:18 PM  Gender:Female  Living status:Living  Apgars:7 ,9  Weight:3498 g   Magnesium Sulfate received: No BMZ received: No Rhophylac:N/A MMR:N/A T-DaP:Given prenatally Flu: Offered prior to discharge Transfusion:No  Physical exam  Vitals:   07/08/20 1940 07/08/20 2001 07/08/20 2130 07/08/20 2233  BP:  135/86 (!) 143/91 (!) 129/54  Pulse:  (!) 101 98 (!) 112  Resp:      Temp: 99.6 F (37.6 C)     TempSrc: Axillary     SpO2:      Weight:      Height:       General: alert, cooperative and no distress Lochia: appropriate Uterine Fundus: firm Incision: N/A DVT Evaluation: No evidence of DVT seen on physical exam. Labs: Lab Results  Component Value Date   WBC 14.7 (H) 07/08/2020   HGB 10.6 (L) 07/08/2020   HCT  34.0 (L) 07/08/2020   MCV 88.8 07/08/2020   PLT 237 07/08/2020   CMP Latest Ref Rng & Units 07/08/2020  Glucose 70 - 99 mg/dL 108(H)  BUN 6 - 20 mg/dL 7  Creatinine 0.44 - 1.00 mg/dL 0.61  Sodium 135 - 145 mmol/L 135  Potassium 3.5 - 5.1 mmol/L 3.5  Chloride 98 - 111 mmol/L 102  CO2 22 - 32 mmol/L 21(L)  Calcium 8.9 - 10.3 mg/dL 9.1  Total Protein 6.5 - 8.1 g/dL 6.7  Total Bilirubin 0.3 - 1.2 mg/dL 0.1(L)  Alkaline Phos 38 - 126 U/L 157(H)  AST 15 - 41 U/L 16  ALT 0 - 44 U/L 15   Edinburgh Score: No flowsheet data found.   After visit meds:  Allergies as of 07/10/2020      Reactions   Shellfish Allergy Hives, Swelling      Medication List    TAKE these medications   acetaminophen 325 MG tablet Commonly known as: Tylenol Take 2 tablets (650 mg total) by mouth every 4 (four) hours.   amLODipine 5 MG tablet Commonly known as: NORVASC Take 1 tablet (5 mg total) by mouth daily.   ferrous sulfate 325 (65 FE) MG tablet Take 1 tablet (325 mg total) by mouth every other day. Start taking on: July 11, 2020  ibuprofen 600 MG tablet Commonly known as: ADVIL Take 1 tablet (600 mg total) by mouth every 6 (six) hours.   norethindrone 0.35 MG tablet Commonly known as: Ortho Micronor Take 1 tablet (0.35 mg total) by mouth daily.   prenatal multivitamin Tabs tablet Take 1 tablet by mouth at bedtime.        Discharge home in stable condition Infant Feeding: Breast Infant Disposition:home with mother Discharge instruction: per After Visit Summary and Postpartum booklet. Activity: Advance as tolerated. Pelvic rest for 6 weeks.  Diet: routine diet Future Appointments: Future Appointments  Date Time Provider [place]  07/09/2020 10:10 AM [facility]-S[facility]ENING [facility] None   Follow up Visit: Roma Schanz, CNM  P [facility] Admin Pool Please schedule this patient for PP visit in: 1wk for bp check, then 6 weeks for pp visit  High risk pregnancy complicated by: GHTN  dx on admission  Delivery mode: Vacuum  Anticipated Birth Control: POPs  PP Procedures needed: bp check and pap  Schedule Integrated BH visit: yes, h/o dep/anx  Provider: Any provider     07/10/2020 EMILY Madelin Headings, MD   I saw and evaluated the patient. I agree with the findings and the plan of care as documented in the resident's note.  Sharene Skeans, MD [facility] Family Medicine Fellow, [facility] for [facility], [place]

## 2020-07-08 NOTE — H&P (Addendum)
OBSTETRIC ADMISSION HISTORY AND PHYSICAL  Sara Jensen is a 25 y.o. female G1P0 with IUP at 66w5dby [place] for SOL and new onset gHTN. She reports +FMs, No LOF, no VB, no blurry vision, headaches or peripheral edema, and RUQ pain.  She plans on breast feeding. She requests POPs for birth control. [facility]  Dating: By [place]--->  Estimated Date of Delivery: 07/10/20  Sono:    @[redacted]w[redacted]d , CWD, normal anatomy, cephalic presentation,  [id number] 46% EFW   Prenatal History/Complications:  Anxiety & depression Echogenic intracardiac focus of fetus, resolved  Alpha thal carrier   Past Medical History: Past Medical History:  Diagnosis Date   Anxiety    Complication of anesthesia    woke up earlier than expected from anesthesia   Depression    Hypertension     Past Surgical History: Past Surgical History:  Procedure Laterality Date   WISDOM TOOTH EXTRACTION      Obstetrical History: OB History     Gravida  1   Para      Term      Preterm      AB      Living         SAB      IAB      Ectopic      Multiple      Live Births              Social History Social History   Socioeconomic History   Marital status: Single    Spouse name: Not on file   Number of children: Not on file   Years of education: Not on file   Highest education level: Not on file  Occupational History   Not on file  Tobacco Use   Smoking status: Never Smoker   Smokeless tobacco: Never Used  Vaping Use   Vaping Use: Former   Quit date: 10/30/2019  Substance and Sexual Activity   Alcohol use: Not Currently    Comment: socially on weekends until pregnancy   Drug use: Not Currently    Types: Marijuana    Comment: stopped when pregnant   Sexual activity: Not Currently    Birth control/protection: Pill    Comment: missed a few pills ,then stopped planning to restart and got pregnant  Other Topics Concern   Not on file  Social History Narrative   Not  on file   Social Determinants of Health   Financial Resource Strain: Not on file  Food Insecurity: No Food Insecurity   Worried About Running Out of Food in the Last Year: Never true   Ran Out of Food in the Last Year: Never true  Transportation Needs: No Transportation Needs   Lack of Transportation (Medical): No   Lack of Transportation (Non-Medical): No  Physical Activity: Not on file  Stress: Not on file  Social Connections: Not on file    Family History: Family History  Problem Relation Age of Onset   Hypertension Mother     Allergies: Allergies  Allergen Reactions   Shellfish Allergy Hives and Swelling    Pt denies allergies to latex, iodine, or shellfish.  Medications Prior to Admission  Medication Sig Dispense Refill Last Dose   Prenatal Vit-Fe Fumarate-FA (PRENATAL VITAMINS PO) Take 1 tablet by mouth daily.   07/07/2020 at Unknown time     Review of Systems   All systems reviewed and negative except as stated in HPI  Blood  pressure (!) 144/86, pulse 84, temperature 98.1 F (36.7 C), temperature source Oral, resp. rate 16, height 5' 2"  (1.575 m), weight 93.9 kg, last menstrual period 09/23/2019, SpO2 99 %. General appearance: alert, cooperative and no distress Lungs: normal WOB Heart: regular rate  Extremities: no sign of DVT Presentation: cephalic  Fetal monitoringBaseline: 125 bpm, Variability: Good {> 6 bpm), Accelerations: Reactive and Decelerations: Absent Uterine activity: contractions q4-44mn  Dilation: 3 Effacement (%): 80 Station: -1 Exam by:: Sara Jensen  Prenatal labs: ABO, Rh: A/Positive/-- (07/12 0920) Antibody: Negative (07/12 0920) Rubella: 5.19 (07/12 0920) RPR: Non Reactive (11/17 0858)  HBsAg: Negative (07/12 0920)  HIV: Non Reactive (11/17 0858)  GBS: Negative/-- (01/20 0912)  2 hr Glucola: 118 Genetic screening:  normal Anatomy [place] normal   Prenatal Transfer Tool  Maternal Diabetes: No Genetic Screening:  Normal Maternal Ultrasounds/Referrals: Normal Fetal Ultrasounds or other Referrals:  None Maternal Substance Abuse:  No Significant Maternal Medications:  None Significant Maternal Lab Results: None  No results found for this or any previous visit (from the past 24 hour(s)).  Patient Active Problem List   Diagnosis Date Noted   Echogenic intracardiac focus of fetus on prenatal ultrasound 02/18/2020   Supervision of low-risk pregnancy 11/28/2019   Anxiety    Depression     Assessment/Plan:  CArraya Buckis a 25y.o. G1P0 at 339w5dere for SOL and new onset gHTN.  #Labor: SVE 3cm. Patient uncomfortable with contractions. Will consider starting Pitocin at next check if cervix has not made much change.  #Pain: Unsure about epidural  #FWB: Cat 1 strip  #ID: GBS neg #MOF: breast #MOC: POPs #Circ:  yes #gHTN: elevated BPs today (138/91-150/97). Most recently 150/91. Normal PreE lab. Asymptomatic  #anxiety&depression: SW consult  #echogenic intracardiac focus of fetus: resolved on last [place]  [name], DO  Center for [facility] [facility]/12/2020, 3:12 AM  I personally saw and evaluated the patient, performing the key elements of the service. I developed and verified the management plan that is described in the resident's/student's note, and I agree with the content with my edits above. VSS, HRR&R, Resp unlabored.  FrNigel BertholdCNM 07/08/2020 8:12 AM

## 2020-07-08 NOTE — Plan of Care (Signed)

## 2020-07-08 NOTE — Progress Notes (Signed)
Sara Jensen is a 25 y.o. G1P0 at [redacted]w[redacted]d by USadmitted for SOL and new onset gHTN.  Subjective: Updated by nurse.  Initially thought to be complete, but upon removal of foley, still had some cervix anteriorly, so will continue to titrate pit.    Objective: BP 136/80   Pulse (!) 114   Temp 98.2 F (36.8 C) (Oral)   Resp 16   Ht 5\' 2"  (1.575 m)   Wt 93.9 kg   LMP 09/23/2019 (Within Weeks)   SpO2 100%   BMI 37.86 kg/m  Total I/O In: 1227.6 [I.V.:1227.6] Out: 700 [Urine:700]  FHT:  FHR: 135 bpm, variability: moderate,  accelerations:  Present,  decelerations:  Absent UC:   regular, every 2-3 minutes  SVE:   Dilation: Lip/rim Effacement (%): 100 Station: Plus 1 Exam by:: [email or web address] RN  Pitocin @ 8 mu/min  Labs: Lab Results  Component Value Date   WBC 14.7 (H) 07/08/2020   HGB 10.6 (L) 07/08/2020   HCT 34.0 (L) 07/08/2020   MCV 88.8 07/08/2020   PLT 237 07/08/2020    Assessment / Plan: Sara Clarkis a 24 y.o.G1P0 at [redacted]w[redacted]d here for SOL and new onset gHTN.  #Labor:AROM with scant clear fluid at 1400.  Cervix changing slowly.  Start pit 2x2.   #Pain:Epidural #FWB:Cat I strip #ID:GBS neg #MOF:breast #MOC:POPs #Circ:yes #gHTN: elevated BPs today (138/91-150/97). Normal PreE lab. Asymptomatic.  Still no severe range #Anxiety&depression: SW consult  #Echogenic intracardiac focus of fetus: resolved on last 10-20-1968  Sara Jensen [place], MD PGY-2 Resident Family Medicine 07/08/2020, 6:32 PM

## 2020-07-08 NOTE — Progress Notes (Addendum)
Sara Jensen is a 25 y.o. G1P0 at [redacted]w[redacted]d by [place] admitted for SOL and new onset gHTN.  Subjective: Epidural in place, comfortable.  No concerns.  Objective: BP 108/77   Pulse 89   Temp 98.4 F (36.9 C) (Oral)   Resp 16   Ht 5\' 2"  (1.575 m)   Wt 93.9 kg   LMP 09/23/2019 (Within Weeks)   SpO2 100%   BMI 37.86 kg/m  Total I/O In: 1227.6 [I.V.:1227.6] Out: -   FHT:  FHR: 130 bpm, variability: moderate,  accelerations:  Present,  decelerations:  Present one late, UC: regular, every 4-5 minutes  SVE:   Dilation: 8 Effacement (%): 90 Station: -2 Exam by:: Dr. [email or web address]  Labs: Lab Results  Component Value Date   WBC 14.7 (H) 07/08/2020   HGB 10.6 (L) 07/08/2020   HCT 34.0 (L) 07/08/2020   MCV 88.8 07/08/2020   PLT 237 07/08/2020    Assessment / Plan: Sara Jensen is a 25 y.o. G1P0 at [redacted]w[redacted]d here for SOL and new onset gHTN.  #Labor: AROM with scant clear fluid at 1400.  Cervix changing slowly.  Start pit 2x2.   #Pain:  Epidural  #FWB: Cat II strip, but overall reassuring #ID: GBS neg #MOF: breast #MOC: POPs #Circ:   yes #gHTN: elevated BPs today (138/91-150/97). Normal PreE lab. Asymptomatic.  Last BP 108/77 #anxiety&depression: SW consult  #echogenic intracardiac focus of fetus: resolved on last 10-20-1968    EMILY [place], MD PGY-2 Resident Family Medicine 07/08/2020, 2:00 PM   GME ATTESTATION:  I saw and evaluated the patient. I agree with the findings and the plan of care as documented in the resident's note.  09/05/2020, MD OB Fellow, Faculty [facility], Center for [facility] 07/08/2020 2:14 PM

## 2020-07-08 NOTE — Progress Notes (Addendum)
Sara Jensen is a 25 y.o. G1P0 at [redacted]w[redacted]d by [place] admitted for SOL and new onset gHTN.  Subjective: Epidural in place, comfortable.  No concerns.  Objective: BP 121/77   Pulse 99   Temp 98.4 F (36.9 C) (Oral)   Resp 18   Ht 5\' 2"  (1.575 m)   Wt 93.9 kg   LMP 09/23/2019 (Within Weeks)   SpO2 100%   BMI 37.86 kg/m  Total I/O In: 1227.6 [I.V.:1227.6] Out: -   FHT:  FHR: 125 bpm, variability: moderate,  accelerations:  Present,  decelerations:  Absent UC:   regular, every 3-5 minutes  SVE:   Dilation: 7 Effacement (%): 90 Station: -2 Exam by:: Dr. [email or web address]  Labs: Lab Results  Component Value Date   WBC 14.7 (H) 07/08/2020   HGB 10.6 (L) 07/08/2020   HCT 34.0 (L) 07/08/2020   MCV 88.8 07/08/2020   PLT 237 07/08/2020    Assessment / Plan: Sara Jensen is a 25 y.o. G1P0 at [redacted]w[redacted]d here for SOL and new onset gHTN.  #Labor: Cervix unchanged from last check.  AROM with scant clear fluid this check.  Plan to recheck in 2 hours and start pitocin at that time if not significantly changed.   #Pain:  Epidural  #FWB: Cat I strip  #ID: GBS neg #MOF: breast #MOC: POPs #Circ:   yes #gHTN: elevated BPs today (138/91-150/97). Normal PreE lab. Asymptomatic.  Last BP 121/77 #anxiety&depression: SW consult  #echogenic intracardiac focus of fetus: resolved on last 10-20-1968    EMILY [place], MD PGY-2 Resident Family Medicine 07/08/2020, 12:04 PM   GME ATTESTATION:  I saw and evaluated the patient. I agree with the findings and the plan of care as documented in the resident's note.  09/05/2020, MD OB Fellow, Faculty [facility], Center for [facility] 07/08/2020 1:39 PM

## 2020-07-09 ENCOUNTER — Other Ambulatory Visit (HOSPITAL_COMMUNITY): Payer: 59

## 2020-07-09 LAB — CBC
HCT: 34.6 % — ABNORMAL LOW (ref 36.0–46.0)
HCT: 29.1 % — ABNORMAL LOW (ref 36.0–46.0)
Hemoglobin: 10.7 g/dL — ABNORMAL LOW (ref 12.0–15.0)
Hemoglobin: 9.7 g/dL — ABNORMAL LOW (ref 12.0–15.0)
MCH: 28 pg (ref 26.0–34.0)
MCH: 29.5 pg (ref 26.0–34.0)
MCHC: 30.9 g/dL (ref 30.0–36.0)
MCHC: 33.3 g/dL (ref 30.0–36.0)
MCV: 90.6 fL (ref 80.0–100.0)
MCV: 88.4 fL (ref 80.0–100.0)
Platelets: 224 10*3/uL (ref 150–400)
Platelets: 259 10*3/uL (ref 150–400)
RBC: 3.82 MIL/uL — ABNORMAL LOW (ref 3.87–5.11)
RBC: 3.29 MIL/uL — ABNORMAL LOW (ref 3.87–5.11)
RDW: 14.1 % (ref 11.5–15.5)
RDW: 14.1 % (ref 11.5–15.5)
WBC: 22.7 10*3/uL — ABNORMAL HIGH (ref 4.0–10.5)
WBC: 25.5 10*3/uL — ABNORMAL HIGH (ref 4.0–10.5)
nRBC: 0 % (ref 0.0–0.2)
nRBC: 0 % (ref 0.0–0.2)

## 2020-07-09 MED ORDER — ONDANSETRON HCL 4 MG/2ML IJ SOLN: 4.0000 mg | INTRAMUSCULAR | Status: DC | PRN

## 2020-07-09 MED ORDER — ACETAMINOPHEN 325 MG PO TABS
650.0000 mg | ORAL_TABLET | ORAL | Status: DC
  Administered 2020-07-09 – 2020-07-10 (×7): 650 mg via ORAL
  Filled 2020-07-09 (×7): qty 2

## 2020-07-09 MED ORDER — SENNOSIDES-DOCUSATE SODIUM 8.6-50 MG PO TABS
2.0000 | ORAL_TABLET | Freq: Every day | ORAL | Status: DC
  Administered 2020-07-09 – 2020-07-10 (×2): 2 via ORAL
  Filled 2020-07-09 (×2): qty 2

## 2020-07-09 MED ORDER — DIBUCAINE (PERIANAL) 1 % EX OINT: 1.0000 "application " | TOPICAL_OINTMENT | CUTANEOUS | Status: DC | PRN

## 2020-07-09 MED ORDER — PRENATAL MULTIVITAMIN CH
1.0000 | ORAL_TABLET | Freq: Every day | ORAL | Status: DC
  Administered 2020-07-09: 1 via ORAL
  Filled 2020-07-09: qty 1

## 2020-07-09 MED ORDER — SIMETHICONE 80 MG PO CHEW: 80.0000 mg | CHEWABLE_TABLET | ORAL | Status: DC | PRN

## 2020-07-09 MED ORDER — FERROUS SULFATE 325 (65 FE) MG PO TABS
325.0000 mg | ORAL_TABLET | ORAL | Status: DC
  Administered 2020-07-09: 325 mg via ORAL
  Filled 2020-07-09: qty 1

## 2020-07-09 MED ORDER — IBUPROFEN 600 MG PO TABS
600.0000 mg | ORAL_TABLET | Freq: Four times a day (QID) | ORAL | Status: DC
  Administered 2020-07-09 – 2020-07-10 (×5): 600 mg via ORAL
  Filled 2020-07-09 (×5): qty 1

## 2020-07-09 MED ORDER — DIPHENHYDRAMINE HCL 25 MG PO CAPS: 25.0000 mg | ORAL_CAPSULE | Freq: Four times a day (QID) | ORAL | Status: DC | PRN

## 2020-07-09 MED ORDER — ONDANSETRON HCL 4 MG PO TABS
4.0000 mg | ORAL_TABLET | ORAL | Status: DC | PRN
  Administered 2020-07-09: 4 mg via ORAL
  Filled 2020-07-09: qty 1

## 2020-07-09 MED ORDER — TETANUS-DIPHTH-ACELL PERTUSSIS 5-2.5-18.5 LF-MCG/0.5 IM SUSY: 0.5000 mL | PREFILLED_SYRINGE | Freq: Once | INTRAMUSCULAR | Status: DC

## 2020-07-09 MED ORDER — BENZOCAINE-MENTHOL 20-0.5 % EX AERO
1.0000 "application " | INHALATION_SPRAY | CUTANEOUS | Status: DC | PRN
  Administered 2020-07-09: 1 via TOPICAL
  Filled 2020-07-09: qty 56

## 2020-07-09 MED ORDER — WITCH HAZEL-GLYCERIN EX PADS: 1.0000 "application " | MEDICATED_PAD | CUTANEOUS | Status: DC | PRN

## 2020-07-09 MED ORDER — COCONUT OIL OIL: 1.0000 "application " | TOPICAL_OIL | Status: DC | PRN

## 2020-07-09 NOTE — Anesthesia Postprocedure Evaluation (Signed)
Anesthesia Post Note  Patient: Sara Jensen  Procedure(s) Performed: AN AD HOC LABOR EPIDURAL     Patient location during evaluation: Mother Baby Anesthesia Type: Epidural Level of consciousness: awake and alert and oriented Pain management: satisfactory to patient Vital Signs Assessment: post-procedure vital signs reviewed and stable Respiratory status: respiratory function stable Cardiovascular status: stable Postop Assessment: no headache, no backache, epidural receding, patient able to bend at knees, no signs of nausea or vomiting, adequate PO intake and able to ambulate Anesthetic complications: no   No complications documented.  Last Vitals:  Vitals:   07/09/20 0500 07/09/20 0920  BP: 124/75 132/87  Pulse: (!) 109 92  Resp: 18 20  Temp: 36.8 C 37.1 C  SpO2: 100%     Last Pain:  Vitals:   07/09/20 0920  TempSrc:   PainSc: 0-No pain   Pain Goal:                Epidural/Spinal Function Cutaneous sensation: Normal sensation (07/09/20 0920)  Kevin Mario

## 2020-07-09 NOTE — Progress Notes (Signed)
CSW received consult for hx of Anxiety and Depression.  CSW met with MOB to offer support and complete assessment.     CSW introduced self and role. CSW offered a congratulation to MOB and FOB. Both were receptive to the visit. FOB was agreeable to leave the room for MOB privacy. MOB reports feeling "good" about her delivery and denies feeling sad. She denies SI/HI ideations. 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 She saw a therapist at [facility] and was prescribed medication at the time. She reports she has never taken the medications and does not recall the name of the medications prescribed.   CSW still provided education regarding the baby blues period vs. perinatal mood disorders, discussed treatment and gave resources for mental health follow up if concerns arise. MOB reports that she can still contact the therapist she saw at [facility] if needed. MOB is well educated on the signs and symptoms of post partum depression. CSW still recommended she complete a  self-evaluation during the postpartum time period using the New Mom Checklist from Postpartum Progress and encouraged MOB to contact a medical professional if symptoms are noted at any time.   CSW provided review of Sudden Infant Death Syndrome (SIDS) precautions. MOB was able to verbalize the precautions to take to avoid SIDS.   MOB reports having support from the FOB her parents and FOB parents. She reports early preparation for the baby (Carseat, crib etc.) CSW identifies no further need for intervention and no barriers to discharge at this time.  Kathrin Greathouse, Marlinda Mike, MSW Clinical Social Worker  07/09/2020  12:23 PM

## 2020-07-09 NOTE — Progress Notes (Signed)
Post Partum Day 1 Subjective: Eating, drinking, voiding, ambulating well.  +flatus.  Lochia and pain wnl.  Denies dizziness, lightheadedness, or sob. No complaints.   Objective: Blood pressure 124/75, pulse (!) 109, temperature 98.2 F (36.8 C), resp. rate 18, height 5\' 2"  (1.575 m), weight 93.9 kg, last menstrual period 09/23/2019, SpO2 100 %, unknown if currently breastfeeding.  Physical Exam:  General: alert, cooperative and no distress Lochia: appropriate Uterine Fundus: firm Incision: n/a DVT Evaluation: No evidence of DVT seen on physical exam. Negative Homan's sign. No cords or calf tenderness. No significant calf/ankle edema.  Recent Labs    07/08/20 2319 07/09/20 0508  HGB 10.7* 9.7*  HCT 34.6* 29.1*    Assessment/Plan: Plan for discharge tomorrow, Breastfeeding, Lactation consult, Circumcision prior to discharge and Contraception POPs   LOS: 1 day   09/06/20 07/09/2020, 7:19 AM

## 2020-07-09 NOTE — Progress Notes (Signed)
Post Partum Day 1 Subjective: Ambulating, voiding, tolerating PO and + flatus. Reports mild pain from her 2nd degree laceration. Lochia is wnl. Reports no headaches, dizziness, changes in vision or SOB.    Objective: Blood pressure 124/75, pulse (!) 109, temperature 98.2 F (36.8 C), resp. rate 18, height 5\' 2"  (1.575 m), weight 93.9 kg, last menstrual period 09/23/2019, SpO2 100 %, unknown if currently breastfeeding.  Physical Exam:  General: alert, cooperative and no distress Lochia: appropriate Uterine Fundus: firm Incision: N/a DVT Evaluation: No evidence of DVT seen on physical exam. Negative Homan's sign. No cords or calf tenderness. No significant calf/ankle edema.  Recent Labs    07/08/20 2319 07/09/20 0508  HGB 10.7* 9.7*  HCT 34.6* 29.1*    Assessment/Plan: #PPD 1: Plan for discharge tomorrow. Breastfeeding appropriately. Lactation consult placed. Circumcision planned for today. Contraception method of choice will be POPs. #gHTN: Has remained asymptomatic with BP's WNL. Continue monitoring #Anemia: Hb 10.7-->9.7 (07/09/20), will be starting iron pill PO every other day.     LOS: 1 day   09/06/20 07/09/2020, 7:42 AM

## 2020-07-09 NOTE — Lactation Note (Signed)
This note was copied from a baby's chart. Lactation Consultation Note  Patient Name: Sara Jensen LEXNT'Z Date: 07/09/2020 Reason for consult: Follow-up assessment Age:24 hours   P1 mother whose infant is now 88 hours old.  This is a term baby at 39+5 weeks.   Baby was in the nursery getting a circumcision when I arrived.  Mother had no immediate questions/concerns related to breast feeding.  She was happy to report that baby has fed three times since delivery.  Encouraged to continue feeding 8-12 times/24 hours or sooner if baby shows feeding cues.  She is familiar with hand expression.  Colostrum container provided and milk storage times reviewed.  Finger feeding demonstrated.  Discussed possible sleepiness in baby post circumcision.  Encouraged mother to call her RN/LC for assistance as needed.  Family member present.     Maternal Data    Feeding    LATCH Score                    Lactation Tools Discussed/Used    Interventions    Discharge    Consult Status Consult Status: Follow-up Date: 07/10/20 Follow-up type: In-patient    Trevon Strothers R Alonah Lineback 07/09/2020, 12:19 PM

## 2020-07-09 NOTE — Lactation Note (Addendum)
This note was copied from a baby's chart. Lactation Consultation Note  Patient Name: Sara Jensen DHRCB'U Date: 07/09/2020 Reason for consult: Follow-up assessment;Mother's request Age:24 hours Per mom, infant latches well sometimes but not all. Mom has flat nipples that responds well to breast stimulation and when mom pre-pump with hand pump. LC asked mom to hand express a small amount of colostrum out of breast prior to infant latch, mom latched infant on her right breast using the football hold position, infant latched with depth and BF for 7 minutes. LC assisted mom with learning how to use hand pump and mom easily expressed 4 mls of colostrum that was spoon feed to infant. Infant was still cuing to BF, mom re-latched infant on her right breast without assistance after using the hand pump, infant latched with depth, swallows heard and was still breastfeeding at 11 minutes when LC left the room.  Mom knows to call RN or LC lf she needs further assistance with latching infant at the breast. LC discussed with parents that infant may start to cluster feed at 1 day and this is a normal infant pattern. Mom will continue to breastfeed infant according to hunger cues, 8 to 12+ times  or as much as infant wants within 24 hours.  Mom knows how to hand express and give infant extra volume of EBM if she desires by spoon.  Maternal Data Has patient been taught Hand Expression?: Yes  Feeding Mother's Current Feeding Choice: Breast Milk  LATCH Score Latch: Grasps breast easily, tongue down, lips flanged, rhythmical sucking.  Audible Swallowing: Spontaneous and intermittent  Type of Nipple: Flat  Comfort (Breast/Nipple): Soft / non-tender  Hold (Positioning): Assistance needed to correctly position infant at breast and maintain latch.  LATCH Score: 8   Lactation Tools Discussed/Used    Interventions Interventions: Breast massage;Skin to skin;Assisted with latch;Hand express;Pre-pump if  needed;Adjust position;Support pillows;Position options;Expressed milk;Hand pump;Education  Discharge    Consult Status Consult Status: Follow-up Date: 07/10/20 Follow-up type: In-patient    Danelle Earthly 07/09/2020, 7:24 PM

## 2020-07-10 ENCOUNTER — Other Ambulatory Visit: Payer: Self-pay | Admitting: Obstetrics and Gynecology

## 2020-07-10 ENCOUNTER — Ambulatory Visit: Payer: Self-pay

## 2020-07-10 MED ORDER — AMLODIPINE BESYLATE 5 MG PO TABS
5.0000 mg | ORAL_TABLET | Freq: Every day | ORAL | Status: DC
  Administered 2020-07-10: 5 mg via ORAL
  Filled 2020-07-10: qty 1

## 2020-07-10 MED ORDER — FERROUS SULFATE 325 (65 FE) MG PO TABS: 325.0000 mg | ORAL_TABLET | ORAL | 0 refills | Status: DC

## 2020-07-10 MED ORDER — IBUPROFEN 600 MG PO TABS: 600.0000 mg | ORAL_TABLET | Freq: Four times a day (QID) | ORAL | 0 refills | Status: DC

## 2020-07-10 MED ORDER — NORETHINDRONE 0.35 MG PO TABS: 1.0000 | ORAL_TABLET | Freq: Every day | ORAL | 0 refills | Status: DC

## 2020-07-10 MED ORDER — ACETAMINOPHEN 325 MG PO TABS: 650.0000 mg | ORAL_TABLET | ORAL | Status: DC

## 2020-07-10 MED ORDER — AMLODIPINE BESYLATE 5 MG PO TABS: 5.0000 mg | ORAL_TABLET | Freq: Every day | ORAL | 0 refills | Status: DC

## 2020-07-10 MED FILL — FERROUS SULFATE 325 MG TAB: 325 (65 FE) | 60 days supply | Qty: 30 | Fill #0

## 2020-07-10 MED FILL — NORETHINDRONE 0.35 MG TAB: 0.35 | 28 days supply | Qty: 28 | Fill #0

## 2020-07-10 MED FILL — AMLODIPINE BESYLATE 5 MG TA: 5 | 30 days supply | Qty: 30 | Fill #0

## 2020-07-10 NOTE — Lactation Note (Signed)
This note was copied from a baby's chart. Lactation Consultation Note  Patient Name: Sara Jensen HUTML'Y Date: 07/10/2020 Reason for consult: Follow-up assessment;Primapara;1st time breastfeeding;Term Age:24 hours   1031 - 1040 - Lactation followed up with Sara Jensen to conduct discharge education. She was breast feeding her son "Arlander" in football hold on the right breast upon entry. Rhythmic suckling sequences noted. Sara Jensen denied breast pain with latch.  Education conducted at the bedside including normal infant feeding patterns for day 2 (cluster feeding), signs that baby is getting enough to eat at the breast, and output expectations for days 2-4.   Discharge education conducted.  Plan: Breast feed on demand 8-12 times a day.  Offer both breasts in a feeding. Call lactation resources as needed (brochure reviewed) Follow up with OP services via her pediatrician ([facility])  Maternal Data Does the patient have breastfeeding experience prior to this delivery?: No  Feeding Mother's Current Feeding Choice: Breast Milk  LATCH Score Latch: Grasps breast easily, tongue down, lips flanged, rhythmical sucking.  Audible Swallowing: A few with stimulation  Type of Nipple: Everted at rest and after stimulation  Comfort (Breast/Nipple): Soft / non-tender  Hold (Positioning): No assistance needed to correctly position infant at breast.  LATCH Score: 9   Lactation Tools Discussed/Used    Interventions Interventions: Breast feeding basics reviewed;Education  Discharge Discharge Education: Engorgement and breast care;Warning signs for feeding baby;Outpatient recommendation Pump: Personal WIC Program: Yes  Consult Status Consult Status: Complete Date: 07/10/20 Follow-up type: Call as needed    Walker Shadow 07/10/2020, 11:11 AM

## 2020-07-11 ENCOUNTER — Inpatient Hospital Stay (HOSPITAL_COMMUNITY): Admission: AD | Admit: 2020-07-11 | Payer: 59 | Source: Home / Self Care | Admitting: Obstetrics and Gynecology

## 2020-07-11 ENCOUNTER — Inpatient Hospital Stay (HOSPITAL_COMMUNITY): Payer: 59

## 2020-07-11 ENCOUNTER — Encounter: Payer: 59 | Admitting: Student

## 2020-07-11 LAB — SURGICAL PATHOLOGY

## 2020-07-15 ENCOUNTER — Encounter: Payer: Self-pay | Admitting: Certified Nurse Midwife

## 2020-07-15 ENCOUNTER — Ambulatory Visit (INDEPENDENT_AMBULATORY_CARE_PROVIDER_SITE_OTHER): Payer: 59 | Admitting: Family Medicine

## 2020-07-15 ENCOUNTER — Ambulatory Visit (INDEPENDENT_AMBULATORY_CARE_PROVIDER_SITE_OTHER): Payer: 59

## 2020-07-15 ENCOUNTER — Other Ambulatory Visit: Payer: Self-pay

## 2020-07-15 VITALS — BP 126/92 | HR 85 | Ht 63.0 in | Wt 184.4 lb

## 2020-07-15 DIAGNOSIS — Z3490 Encounter for supervision of normal pregnancy, unspecified, unspecified trimester: Secondary | ICD-10-CM

## 2020-07-15 DIAGNOSIS — I10 Essential (primary) hypertension: Secondary | ICD-10-CM

## 2020-07-15 MED ORDER — OXYCODONE HCL 5 MG PO CAPS: 5.0000 mg | ORAL_CAPSULE | ORAL | 0 refills | Status: DC | PRN

## 2020-07-15 MED ORDER — AMLODIPINE BESYLATE 10 MG PO TABS: 10.0000 mg | ORAL_TABLET | Freq: Every day | ORAL | 3 refills | Status: DC

## 2020-07-15 NOTE — Progress Notes (Signed)
GYNECOLOGY OFFICE VISIT NOTE  History:   Sara Jensen is a 25 y.o. G1P1001 here today for follow up of perineal laceration.  Patient is s/p VAVD on 2/8/82022 with 2nd degree perineal laceration that was repaired with 3-0 vicryl Patient reports persistent discomfort and pain from suture material not relieved by usual measures Has been using ice packs, benzocaine spray, scheduled ibuprofen and tylenol, all without significant improvement Wondering what else can be done  Past Medical History:  Diagnosis Date  . Anxiety   . Complication of anesthesia    woke up earlier than expected from anesthesia  . Depression   . Hypertension     Past Surgical History:  Procedure Laterality Date  . WISDOM TOOTH EXTRACTION      The following portions of the patient's history were reviewed and updated as appropriate: allergies, current medications, past family history, past medical history, past social history, past surgical history and problem list.   Health Maintenance:  Normal pap and negative HRHPV: none on file.  Normal mammogram: n/a.   Review of Systems:  Pertinent items noted in HPI and remainder of comprehensive ROS otherwise negative.  Physical Exam:  BP 137/83   Pulse 65   Wt 184 lb 12.8 oz (83.8 kg)   LMP 09/23/2019 (Within Weeks)   BMI 32.74 kg/m  CONSTITUTIONAL: Well-developed, well-nourished female in no acute distress.  HEENT:  Normocephalic, atraumatic. External right and left ear normal. No scleral icterus.  NECK: Normal range of motion, supple, no masses noted on observation SKIN: No rash noted. Not diaphoretic. No erythema. No pallor. MUSCULOSKELETAL: Normal range of motion. No edema noted. NEUROLOGIC: Alert and oriented to person, place, and time. Normal muscle tone coordination.  PSYCHIATRIC: Normal mood and affect. Normal behavior. Normal judgment and thought content. RESPIRATORY: Effort normal, no problems with respiration noted PELVIC: Perineal laceration  healing well, ~1.5 cm length of suture present anterior to hymenal ring  Labs and Imaging Results for orders placed or performed during the hospital encounter of 07/08/20 (from the past 168 hour(s))  Surgical pathology   Collection Time: 07/08/20 10:27 PM  Result Value Ref Range   SURGICAL PATHOLOGY      SURGICAL PATHOLOGY CASE: [id number] PATIENT: Sara Duffel Surgical Pathology Report     Clinical History: Gestational hypertension (kd)     FINAL MICROSCOPIC DIAGNOSIS:  A. PLACENTA, SINGELTON, DELIVERY: - 512 g placenta.  Mild acute chorioamnionitis.  Mild funisitis   GROSS DESCRIPTION:  Specimen received: Singleton placenta, received fresh. Size and shape: 19 x 18 x 2.5 cm, discoid. Weight: 512 g excluding the cord and membranes. Umbilical cord: Eccentrically inserted, 32 cm in length, uniform diameter and characteristic trivasculature. Membranes: Marginally inserted, smooth, tan-pink and semi-translucent. Fetal surface: Smooth, blue-gray. Maternal surface: Spongy, red-brown, with intact cotyledons. Cut surface: Unremarkable. Block summary: 4 blocks submitted (GRP 07/10/2020).    Final Diagnosis performed by Consuello Bossier, MD.   Electronically signed 07/11/2020 Technical component performed at [facility], [address], [place], [place] [address].  Professional component performed at [facility] [employer] [facility], [address] [place], [place] [address].  Immunohistochemistry Technical component (if applicable) was performed at [facility]. [address], [place], [place] [address].   IMMUNOHISTOCHEMISTRY DISCLAIMER (if applicable): Some of these immunohistochemical stains may have been developed and the performance characteristics determine by [facility]. Some may not have been cleared or approved by the U.S. Food and Drug Administration. The FDA has determined that  such clearance or  approval is not necessary. This test is used for clinical purposes. It should not be regarded as investigational or for research. This laboratory is certified under the Clinical Laboratory Improvement Amendments of 1988 (CLIA-88) as qualified to perform high complexity clinical laboratory testing.  The controls stained appropriately.   CBC   Collection Time: 07/08/20 11:19 PM  Result Value Ref Range   WBC 22.7 (H) 4.0 - 10.5 K/uL   RBC 3.82 (L) 3.87 - 5.11 MIL/uL   Hemoglobin 10.7 (L) 12.0 - 15.0 g/dL   HCT 16.134.6 (L) 09.636.0 - 04.546.0 %   MCV 90.6 80.0 - 100.0 fL   MCH 28.0 26.0 - 34.0 pg   MCHC 30.9 30.0 - 36.0 g/dL   RDW 40.914.1 81.111.5 - 91.415.5 %   Platelets 224 150 - 400 K/uL   nRBC 0.0 0.0 - 0.2 %  CBC   Collection Time: 07/09/20  5:08 AM  Result Value Ref Range   WBC 25.5 (H) 4.0 - 10.5 K/uL   RBC 3.29 (L) 3.87 - 5.11 MIL/uL   Hemoglobin 9.7 (L) 12.0 - 15.0 g/dL   HCT 78.229.1 (L) 95.636.0 - 21.346.0 %   MCV 88.4 80.0 - 100.0 fL   MCH 29.5 26.0 - 34.0 pg   MCHC 33.3 30.0 - 36.0 g/dL   RDW 08.614.1 57.811.5 - 46.915.5 %   Platelets 259 150 - 400 K/uL   nRBC 0.0 0.0 - 0.2 %   US MFM OB FOLLOW UP  Result Date: 06/27/2020 ----------------------------------------------------------------------  OBSTETRICS REPORT                       (Signed Final 06/27/2020 11:04 am) ---------------------------------------------------------------------- Patient Info  ID #:       [id number]                          D.O.B.:  1995/11/08 (24 yrs)  Name:       Sara Jensen                    Visit Date: 06/27/2020 10:20 am ---------------------------------------------------------------------- Performed By  Attending:        Lin Landsmanorenthian Booker      Ref. Address:     [address]                    MD                                                             [address] [place],                                                             [place] [id number]  Performed By:     Truitt Leepiana Strickland,      Location:         [facility]  Referred By:      Marny Lowenstein                    [place] ---------------------------------------------------------------------- Orders  #  Description                           Code        Ordered By  1  [place] MFM OB FOLLOW UP                   10626.94    Edd Arbour ----------------------------------------------------------------------  #  Order #                     Accession #                Episode #  1  [id number]                   [id number]                 [id number] ---------------------------------------------------------------------- Indications  Encounter for antenatal screening for          Z36.3  malformations (low risk NIPS)  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 Cord Insertion:      Previously Visualized  Amniotic Fluid  AFI FV:      Within normal limits  AFI Sum(cm)     %Tile       Largest Pocket(cm)  10.97           33          4.62                RLQ(cm)       LUQ(cm)        LLQ(cm)  3.17          4.62           3.19 ---------------------------------------------------------------------- Biometry  BPD:      89.3  mm     G. Age:  36w 1d         21  %    CI:        76.03   %    70 - 86                                                          FL/HC:      21.6   %    20.9 - 22.7  HC:      324.6  mm     G.  Age:  36w 5d          8  %    HC/AC:      0.94        0.92 - 1.05  AC:       347   mm     G. Age:  38w 4d         80  %    FL/BPD:     78.5   %    71 - 87  FL:       70.1  mm     G. Age:  36w 0d          8  %    FL/AC:      20.2   %    20 - 24  LV:        3.8  mm  Est. FW:    3224  gm      7 lb 2 oz     46  % ---------------------------------------------------------------------- OB History  Gravidity:    1 ---------------------------------------------------------------------- Gestational Age  U/S Today:     36w 6d                                        EDD:   07/19/20  Best:          38w 1d     Det. By:  Previous Ultrasound      EDD:   07/10/20                                      (12/05/19) ---------------------------------------------------------------------- Anatomy  Cranium:               Appears normal         Heart:                  Appears normal;                                                                        EIF NOT seen  Cavum:  Appears normal         RVOT:                   Appears normal  Ventricles:            Appears normal         LVOT:                   Appears normal  Choroid Plexus:        Appears normal         Aortic Arch:            Appears normal  Cerebellum:            Appears normal         Ductal Arch:            Previously seen  Posterior Fossa:       Appears normal         Stomach:                Appears normal, left                                                                        sided  Face:                  Appears normal         Kidneys:                Appear normal                         (orbits and profile)  Lips:                  Appears normal         Bladder:                Appears normal  Other:  Female gender previously seen. Other anatomy previously imaged and          appeared normal. ---------------------------------------------------------------------- Impression  Follow up growth due to size greater than dates.  Normal interval growth with  measurements consistent with  dates  Good fetal movement and amniotic fluid volume  An EIF was previously visualized but it was not seen today. ---------------------------------------------------------------------- Recommendations  Follow up as clinically indicated. ----------------------------------------------------------------------               Lin Landsman, MD Electronically Signed Final Report   06/27/2020 11:04 am ----------------------------------------------------------------------     Assessment and Plan:   Problem List Items Addressed This Visit   None   Visit Diagnoses    Perineal laceration with delivery, second degree, delivered    -  Primary    Exam unremarkable and c/w normal healing laceration. Suture knot left slightly long, offered to trim further but patient declined. Discussed that beyond what she is already doing only opioid medications are an option. After counseling on risks and benefits a small prescription of oxycodone was sent with instructions to use sparingly. [place] PAMP checked with no recent opioid rx. Patient to return in 1 week for BP check.    Return in about 1 week (around 07/22/2020)  for BP check.    Total face-to-face time with patient: 15 minutes.  Over 50% of encounter was spent on counseling and coordination of care.   Venora Maples, MD/[facility] [employer], [facility]

## 2020-07-15 NOTE — Patient Instructions (Signed)
Obstetrics and Gynecology, 128(1), e1-e15. https://doi.org/10.1097/AOG.0000000000001523">  Care of a Perineal Tear The following information offers guidance about how to care for a perineal tear (laceration). Some women develop a perineal tear during a vaginal birth. This can happen as the baby emerges from the birth canal and the area between the vagina and the anus (perineum) is stretched, or a surgical cut is made (episiotomy). There are four degrees of perineal tears based on the depth and length of the laceration.  First degree. This involves a shallow tear at the edge of the vaginal opening that extends slightly into the perineal skin.  Second degree. This involves tearing described in first-degree perineal tear, and an additional deeper tear of the vaginal opening and perineal tissues. It may also include tearing of a muscle just under the perineal skin.  Third degree. This involves tearing described in first-degree and second-degree perineal tears. Tearing in the third degree extends into the muscle of the anus (anal sphincter).  Fourth degree. This involves tears described in all three degrees. Tearing in the fourth degree extends into the rectum. First-degree and second-degree perineal tears may or may not be stitched closed, depending on their location and appearance. Third-degree and fourth-degree perineal tears are stitched closed immediately after the baby's birth. What are the risks? Depending on the type of perineal tear you have, you may be at risk for:  Bleeding.  Developing a collection of blood in the perineal tear area (hematoma).  Pain when you urinate or have a bowel movement.  Infection at the site of the tear.  Fever.  Trouble controlling urination or bowel movements (incontinence).  Painful sex. How to care for a perineal tear Wound care  Take sitz baths as told by your health care provider to speed up healing. In a sitz bath, you sit down in warm water. A  sitz bath can be taken in one of two ways: ? Using an over-the-toilet sitz bath. ? Using a bathtub. The water should be deep enough to cover your hips and buttocks.  Wear a sanitary pad as told by your health care provider. Change the pad as often as told.  Leave stitches (sutures), skin glue, or adhesive tape in place. Do not remove adhesive tape unless your health care provider tells you to do so.  Check your wound every day for signs of infection. Check for: ? Redness, swelling, or pain. ? Fluid or blood. ? Warmth. ? Pus or a bad smell.      Managing pain  If directed, put ice on the affected area. To do this: ? Put ice in a plastic bag. ? Place a towel between your skin and the bag. ? Leave the ice on for 20 minutes, 2-3 times a day. ? Remove the ice if your skin turns bright red. This is very important. If you cannot feel pain, heat, or cold, you have a greater risk of damage to the area.  Apply a numbing spray to the perineal tear site as told by your health care provider. This may help with discomfort.  Take or apply over-the-counter and prescription medicines only as told by your health care provider.  If told, put about three witch hazel-containing hemorrhoid treatment pads on top of your sanitary pad. The witch hazel in the hemorrhoid pads helps with swelling and discomfort.  If comfortable, sit on an inflatable ring or pillow.   General instructions  Use a squirt bottle to squeeze warm water on your perineum after urinating to   clean the area. This is instead of wiping and should be done from front to back. Pat the area gently to dry it.  Do not have sex, use tampons, or place anything in your vagina for at least 6 weeks or as told by your health care provider.  Keep all follow-up visits, including postpartum visits. This is important. Contact a health care provider if:  Your pain is not relieved with medicines.  You have painful urination.  You have redness,  swelling, or pain around your tear.  You have fluid or blood coming from your tear.  Your tear feels warm to the touch.  You have pus or a bad smell coming from your tear.  You have a fever. Get help right away if:  Your tear opens.  You cannot urinate.  You have an increase in bleeding.  You have severe pain. Summary  A perineal tear is a tear (laceration) in the tissue between the opening of the vagina and the anus (perineum).  There are four degrees of perineal tears based on how deep and long the laceration is.  First-degree and second-degree perineal tears may or may not be stitched closed, depending on their location and appearance. Third-degree and fourth-degree perineal tears are stitched closed immediately after the baby's birth.  Get help right away if your tear opens, you cannot urinate, you have an increase in bleeding, or you have severe pain. This information is not intended to replace advice given to you by your health care provider. Make sure you discuss any questions you have with your health care provider. Document Revised: 01/31/2020 Document Reviewed: 01/31/2020 Elsevier Patient Education  2021 [employer]  

## 2020-07-15 NOTE — Progress Notes (Deleted)
GYNECOLOGY OFFICE VISIT NOTE  History:   Sara Jensen is a 25 y.o. G1P1001 here today for ***.  ***  Past Medical History:  Diagnosis Date  . Anxiety   . Complication of anesthesia    woke up earlier than expected from anesthesia  . Depression   . Hypertension     Past Surgical History:  Procedure Laterality Date  . WISDOM TOOTH EXTRACTION      The following portions of the patient's history were reviewed and updated as appropriate: allergies, current medications, past family history, past medical history, past social history, past surgical history and problem list.   Health Maintenance:  Normal pap and negative HRHPV: ***.  Normal mammogram: ***.   Review of Systems:  Pertinent items noted in HPI and remainder of comprehensive ROS otherwise negative.  Physical Exam:  BP 137/83   Pulse 65   Wt 184 lb 12.8 oz (83.8 kg)   LMP 09/23/2019 (Within Weeks)   BMI 32.74 kg/m  CONSTITUTIONAL: Well-developed, well-nourished female in no acute distress.  HEENT:  Normocephalic, atraumatic. External right and left ear normal. No scleral icterus.  NECK: Normal range of motion, supple, no masses noted on observation SKIN: No rash noted. Not diaphoretic. No erythema. No pallor. MUSCULOSKELETAL: Normal range of motion. No edema noted. NEUROLOGIC: Alert and oriented to person, place, and time. Normal muscle tone coordination.  PSYCHIATRIC: Normal mood and affect. Normal behavior. Normal judgment and thought content. RESPIRATORY: Effort normal, no problems with respiration noted ABDOMEN: No masses noted. No other overt distention noted.  *** PELVIC: {Blank single:19197::"Deferred","Normal appearing external genitalia; normal appearing vaginal mucosa and cervix.  No abnormal discharge noted.  Normal uterine size, no other palpable masses, no uterine or adnexal tenderness."}  Labs and Imaging Results for orders placed or performed during the hospital encounter of 07/08/20 (from the past  168 hour(s))  Surgical pathology   Collection Time: 07/08/20 10:27 PM  Result Value Ref Range   SURGICAL PATHOLOGY      SURGICAL PATHOLOGY CASE: [id number] PATIENT: Sara DuffelARDEN Gehring Surgical Pathology Report     Clinical History: Gestational hypertension (kd)     FINAL MICROSCOPIC DIAGNOSIS:  A. PLACENTA, SINGELTON, DELIVERY: - 512 g placenta.  Mild acute chorioamnionitis.  Mild funisitis   GROSS DESCRIPTION:  Specimen received: Singleton placenta, received fresh. Size and shape: 19 x 18 x 2.5 cm, discoid. Weight: 512 g excluding the cord and membranes. Umbilical cord: Eccentrically inserted, 32 cm in length, uniform diameter and characteristic trivasculature. Membranes: Marginally inserted, smooth, tan-pink and semi-translucent. Fetal surface: Smooth, blue-gray. Maternal surface: Spongy, red-brown, with intact cotyledons. Cut surface: Unremarkable. Block summary: 4 blocks submitted (GRP 07/10/2020).    Final Diagnosis performed by Consuello BossierAnastasia Canacci, MD.   Electronically signed 07/11/2020 Technical component performed at [facility], [address] [place], [place] [address].  Professional component performed at [facility], [address], [place], [place] [address].  Immunohistochemistry Technical component (if applicable) was performed at [facility]. [address], [place], [place] [address].   IMMUNOHISTOCHEMISTRY DISCLAIMER (if applicable): Some of these immunohistochemical stains may have been developed and the performance characteristics determine by [facility]. Some may not have been cleared or approved by the U.S. Food and Drug Administration. The FDA has determined that such clearance or approval is not necessary. This test is used for clinical purposes. It should not be regarded as investigational or for research. This laboratory is certified under the Clinical  Laboratory  Improvement Amendments of 1988 (CLIA-88) as qualified to perform high complexity clinical laboratory testing.  The controls stained appropriately.   CBC   Collection Time: 07/08/20 11:19 PM  Result Value Ref Range   WBC 22.7 (H) 4.0 - 10.5 K/uL   RBC 3.82 (L) 3.87 - 5.11 MIL/uL   Hemoglobin 10.7 (L) 12.0 - 15.0 g/dL   HCT 40.9 (L) 81.1 - 91.4 %   MCV 90.6 80.0 - 100.0 fL   MCH 28.0 26.0 - 34.0 pg   MCHC 30.9 30.0 - 36.0 g/dL   RDW 78.2 95.6 - 21.3 %   Platelets 224 150 - 400 K/uL   nRBC 0.0 0.0 - 0.2 %  CBC   Collection Time: 07/09/20  5:08 AM  Result Value Ref Range   WBC 25.5 (H) 4.0 - 10.5 K/uL   RBC 3.29 (L) 3.87 - 5.11 MIL/uL   Hemoglobin 9.7 (L) 12.0 - 15.0 g/dL   HCT 08.6 (L) 57.8 - 46.9 %   MCV 88.4 80.0 - 100.0 fL   MCH 29.5 26.0 - 34.0 pg   MCHC 33.3 30.0 - 36.0 g/dL   RDW 62.9 52.8 - 41.3 %   Platelets 259 150 - 400 K/uL   nRBC 0.0 0.0 - 0.2 %   [place] MFM OB FOLLOW UP  Result Date: 06/27/2020 ----------------------------------------------------------------------  OBSTETRICS REPORT                       (Signed Final 06/27/2020 11:04 am) ---------------------------------------------------------------------- Patient Info  ID #:       [id number]                          D.O.B.:  [birth year] (24 yrs)  Name:       Sara Jensen                    Visit Date: 06/27/2020 10:20 am ---------------------------------------------------------------------- Performed By  Attending:        Lin Landsman      Ref. Address:     [address]                    MD                                                             [address] [place],                                                             [place] [address]  Performed By:     Truitt Leep,      Location:         [facility]                                [facility]  Referred By:      Marny Lowenstein                    PA ---------------------------------------------------------------------- Orders  #  Description                           Code        Ordered By  1  [place] MFM OB FOLLOW UP                   22979.89    Edd Arbour ----------------------------------------------------------------------  #  Order #                     Accession #                Episode #  1  [id number]                   [id number]                 [id number] ---------------------------------------------------------------------- Indications  Encounter for antenatal screening for          Z36.3  malformations (low risk NIPS)  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 Cord Insertion:      Previously Visualized  Amniotic Fluid  AFI FV:      Within normal limits  AFI Sum(cm)     %Tile       Largest Pocket(cm)  10.97           33          4.62                RLQ(cm)       LUQ(cm)        LLQ(cm)                3.17          4.62           3.19 ---------------------------------------------------------------------- Biometry  BPD:      89.3  mm     G. Age:  36w 1d         21  %  CI:        76.03   %    70 - 86                                                          FL/HC:      21.6   %    20.9 - 22.7  HC:      324.6  mm     G. Age:  36w 5d          8  %    HC/AC:      0.94        0.92 - 1.05  AC:       347   mm     G. Age:  38w 4d         80  %    FL/BPD:     78.5   %    71 - 87  FL:       70.1  mm     G. Age:   36w 0d          8  %    FL/AC:      20.2   %    20 - 24  LV:        3.8  mm  Est. FW:    3224  gm      7 lb 2 oz     46  % ---------------------------------------------------------------------- OB History  Gravidity:    1 ---------------------------------------------------------------------- Gestational Age  U/S Today:     36w 6d                                        EDD:   07/19/20  Best:          38w 1d     Det. By:  Previous Ultrasound      EDD:   07/10/20                                      (12/05/19) ---------------------------------------------------------------------- Anatomy  Cranium:               Appears normal         Heart:                  Appears normal;                                                                        EIF NOT seen  Cavum:                 Appears normal         RVOT:                   Appears normal  Ventricles:            Appears normal  LVOT:                   Appears normal  Choroid Plexus:        Appears normal         Aortic Arch:            Appears normal  Cerebellum:            Appears normal         Ductal Arch:            Previously seen  Posterior Fossa:       Appears normal         Stomach:                Appears normal, left                                                                        sided  Face:                  Appears normal         Kidneys:                Appear normal                         (orbits and profile)  Lips:                  Appears normal         Bladder:                Appears normal  Other:  Female gender previously seen. Other anatomy previously imaged and          appeared normal. ---------------------------------------------------------------------- Impression  Follow up growth due to size greater than dates.  Normal interval growth with measurements consistent with  dates  Good fetal movement and amniotic fluid volume  An EIF was previously visualized but it was not seen today.  ---------------------------------------------------------------------- Recommendations  Follow up as clinically indicated. ----------------------------------------------------------------------               Lin Landsman, MD Electronically Signed Final Report   06/27/2020 11:04 am ----------------------------------------------------------------------     Assessment and Plan:   Problem List Items Addressed This Visit   None     Routine preventative health maintenance measures emphasized. Please refer to After Visit Summary for other counseling recommendations.   No follow-ups on file.    Total face-to-face time with patient: {Blank single:19197::"10","15","20","25","30"} minutes.  Over 50% of encounter was spent on counseling and coordination of care.   Venora Maples, MD/MPH Center for [employer], [facility]

## 2020-07-15 NOTE — Progress Notes (Signed)
Patient was assessed and managed by nursing staff during this encounter. I have reviewed the chart and agree with the documentation and plan.   Sharyon Cable, CNM 07/15/2020 1:24 PM

## 2020-07-15 NOTE — Progress Notes (Signed)
Pt here for BP check today on R-131/97 & L-126/92 currently taking Amlodipine 5 mg, pt states only have headaches off & on, taking Tylenol or Ibuprofen as needed. Discussed readings with Steward Drone, CNM, was advised to increase Amlodipine to 10 mg & increase Ibuprofen to 800 mg. Pt advised just will take up to 800 mg of her current Ibuprofen. Pt also expressed having a lot of pain with her 2nd degree laceration area, she wanted to know why she was not Rx any pain meds. Advised Pt they normally do not Rx  Pain meds for this type of laceration, advised to use Ice packs with Jeanann Lewandowsky which she stated does not work.

## 2020-07-17 ENCOUNTER — Inpatient Hospital Stay (HOSPITAL_COMMUNITY): Admission: AD | Admit: 2020-07-17 | Payer: 59 | Source: Home / Self Care | Admitting: Obstetrics and Gynecology

## 2020-07-17 ENCOUNTER — Inpatient Hospital Stay (HOSPITAL_COMMUNITY): Payer: 59

## 2020-07-17 ENCOUNTER — Telehealth: Payer: Self-pay | Admitting: Lactation Services

## 2020-07-17 NOTE — Telephone Encounter (Signed)
Patient called and LM on nurse voicemail. She reports she turned in her FMLA paperwork in December and it has not been sent or received by [employer]. She had her baby on 02/08 and needs her paperwork submitted prior to 02/25.

## 2020-07-18 ENCOUNTER — Telehealth: Payer: Self-pay | Admitting: Family Medicine

## 2020-07-18 NOTE — Telephone Encounter (Signed)
Pt called concerned about FMLA paperwork that was received 06-04-20 and wanted to make sure forms were completed.  I advised pt that forms were completed 06-09-20 and sent to [employer].Pt was concerned that forms did not have actual delivery date. I advised pt that the forms were for expected date of delivery and that unable to put actual date bc pt had not delivered yet and that was also on the form in section A. I advised pt that if she needs to get additional paperwork filled out she can bring that in and we will get additional forms completed. Pt agreed and understood.

## 2020-07-22 ENCOUNTER — Other Ambulatory Visit: Payer: Self-pay

## 2020-07-22 ENCOUNTER — Ambulatory Visit: Payer: 59

## 2020-07-22 ENCOUNTER — Ambulatory Visit (INDEPENDENT_AMBULATORY_CARE_PROVIDER_SITE_OTHER): Payer: 59 | Admitting: General Practice

## 2020-07-22 VITALS — BP 121/81 | HR 76 | Ht 63.0 in | Wt 179.0 lb

## 2020-07-22 DIAGNOSIS — Z0130 Encounter for examination of blood pressure without abnormal findings: Secondary | ICD-10-CM

## 2020-07-22 NOTE — Progress Notes (Signed)
Patient presents to office today for follow up BP check. Patient delivered vaginally on 2/8 and was diagnosed with gestational HTN upon hospital admission. Patient was seen in office on 2/15 and was noted to have elevated BP. Norvasc prescription was increased from 5mg  to 10mg  at that time. Patient denies headaches, dizziness or blurry vision. BP 121/81 today. Advised she continue Norvasc Rx until pp visit. Patient will follow up at pp visit on 3/22.   RN BSN 07/22/20

## 2020-07-22 NOTE — Progress Notes (Signed)
Chart reviewed for nurse visit. Agree with plan of care.   Venora Maples, MD 07/22/20 9:09 PM

## 2020-08-12 ENCOUNTER — Other Ambulatory Visit: Payer: Self-pay

## 2020-08-12 DIAGNOSIS — Z3041 Encounter for surveillance of contraceptive pills: Secondary | ICD-10-CM

## 2020-08-12 MED ORDER — NORETHINDRONE 0.35 MG PO TABS: 1.0000 | ORAL_TABLET | Freq: Every day | ORAL | 0 refills | Status: DC

## 2020-08-12 NOTE — Progress Notes (Signed)
Pt came into front office requesting refill of birth control pills. Pt was only prescribe 28 day pack of OCPs at discharge. Printed rx given. Pt to return 08/19/20 for PP appt.

## 2020-08-19 ENCOUNTER — Other Ambulatory Visit: Payer: Self-pay

## 2020-08-19 ENCOUNTER — Encounter: Payer: Self-pay | Admitting: Obstetrics and Gynecology

## 2020-08-19 ENCOUNTER — Ambulatory Visit (INDEPENDENT_AMBULATORY_CARE_PROVIDER_SITE_OTHER): Payer: 59 | Admitting: Obstetrics and Gynecology

## 2020-08-19 DIAGNOSIS — Z3041 Encounter for surveillance of contraceptive pills: Secondary | ICD-10-CM

## 2020-08-19 MED ORDER — NORETHINDRONE 0.35 MG PO TABS: 1.0000 | ORAL_TABLET | Freq: Every day | ORAL | 11 refills | Status: DC

## 2020-08-19 MED ORDER — POLYETHYLENE GLYCOL 3350 17 GM/SCOOP PO POWD: 17.0000 g | ORAL | 0 refills | Status: DC | PRN

## 2020-08-19 NOTE — Addendum Note (Signed)
Addended by: Kathee Delton on: 08/19/2020 01:47 PM   Modules accepted: Orders

## 2020-08-19 NOTE — Progress Notes (Signed)
Post Partum Visit Note  Sara Jensen is a 25 y.o. G21P1001 female who presents for a postpartum visit. She is 6 weeks postpartum following a vacuum-assisted vaginal delivery.  I have fully reviewed the prenatal and intrapartum course. The delivery was at 39.5 gestational weeks.  Anesthesia: epidural. Postpartum course has been uncomplicated. Baby is doing well. Baby is feeding by breast. Bleeding no bleeding. Bowel function is abnormal: constipation. Bladder function is normal. Patient is not sexually active. Contraception method is oral progesterone-only contraceptive. Postpartum depression screening: negative.   Gestational HTN - discharged on norvasc. BP today Is 117/78. No episodes of hypotension.  The pregnancy intention screening data noted above was reviewed. Potential methods of contraception were discussed. The patient is on POP's.    Edinburgh Postnatal Depression Scale - 08/19/20 1039      Edinburgh Postnatal Depression Scale:  In the Past 7 Days   I have been able to laugh and see the funny side of things. 0    I have looked forward with enjoyment to things. 0    I have blamed myself unnecessarily when things went wrong. 0    I have been anxious or worried for no good reason. 0    I have felt scared or panicky for no good reason. 0    Things have been getting on top of me. 0    I have been so unhappy that I have had difficulty sleeping. 0    I have felt sad or miserable. 0    I have been so unhappy that I have been crying. 0    The thought of harming myself has occurred to me. 0    Edinburgh Postnatal Depression Scale Total 0            The following portions of the patient's history were reviewed and updated as appropriate: allergies, current medications, past family history, past medical history, past social history, past surgical history and problem list.  Review of Systems Pertinent items are noted in HPI.    Objective:  BP 117/78   Pulse 85   Ht 5\' 2"  (1.575  m)   Wt 172 lb (78 kg)   LMP 09/23/2019 (Within Weeks)   Breastfeeding Yes   BMI 31.46 kg/m    General:  alert, cooperative and appears stated age   Breasts:  inspection negative, no nipple discharge or bleeding, no masses or nodularity palpable  Lungs: clear to auscultation bilaterally  Heart:  regular rate and rhythm, S1, S2 normal, no murmur, click, rub or gallop  Abdomen: soft, non-tender; bowel sounds normal; no masses,  no organomegaly   Vulva:  normal  Vagina:  laceration well healing, well approximated, perineum intact, tender to palpation of perineum. No sutures noted. No abnormal discharge.   Cervix:  Speculum exam deferred        Rectal Exam: external hemorrhoids        Assessment:    Normal postpartum exam.   Plan:   Essential components of care per ACOG recommendations:  1.  Mood and well being: Patient with negative depression screening today. Reviewed local resources for support.    2. Infant care and feeding:  -Patient currently breastmilk feeding? Yes    3. Sexuality, contraception and birth spacing - Patient does not want a pregnancy in the next year.  - Reviewed forms of contraception in tiered fashion. Patient desired oral progesterone-only contraceptive today.   - Discussed birth spacing of 18 months  4. Sleep and  fatigue -Encouraged family/partner/community support of 4 hrs of uninterrupted sleep to help with mood and fatigue  5. Physical Recovery  - Discussed patients delivery and complications - Patient had a second degree laceration, perineal healing reviewed. Patient expressed understanding - Patient has urinary incontinence? No   - Defer sexual activity until perineal pain improves, at least two more weeks   6.  Health Maintenance - Last pap smear done 3 years ago and was normal with negative HPV. - will return in two months for BP check and pap (deferred today due to perineal pain)   7. Gestational HTN: normotensive today. Can d/c norvasc  today and follow up in 2 months for BP check and pap smear.   Gita Kudo, MD Center for [facility], [facility]

## 2020-08-21 ENCOUNTER — Encounter: Payer: Self-pay | Admitting: Medical

## 2020-10-20 ENCOUNTER — Ambulatory Visit (INDEPENDENT_AMBULATORY_CARE_PROVIDER_SITE_OTHER): Payer: 59 | Admitting: Nurse Practitioner

## 2020-10-20 ENCOUNTER — Encounter: Payer: Self-pay | Admitting: Nurse Practitioner

## 2020-10-20 ENCOUNTER — Other Ambulatory Visit (HOSPITAL_COMMUNITY)
Admission: RE | Admit: 2020-10-20 | Discharge: 2020-10-20 | Disposition: A | Discharge status: Home / Self Care | Payer: 59 | Source: Ambulatory Visit | Attending: Nurse Practitioner | Admitting: Nurse Practitioner

## 2020-10-20 ENCOUNTER — Other Ambulatory Visit: Payer: Self-pay

## 2020-10-20 VITALS — BP 116/80 | HR 73 | Ht 63.0 in | Wt 178.3 lb

## 2020-10-20 DIAGNOSIS — Z01419 Encounter for gynecological examination (general) (routine) without abnormal findings: Secondary | ICD-10-CM | POA: Diagnosis not present

## 2020-10-20 DIAGNOSIS — Z124 Encounter for screening for malignant neoplasm of cervix: Secondary | ICD-10-CM | POA: Insufficient documentation

## 2020-10-20 DIAGNOSIS — Z8759 Personal history of other complications of pregnancy, childbirth and the puerperium: Secondary | ICD-10-CM

## 2020-10-20 DIAGNOSIS — Z3041 Encounter for surveillance of contraceptive pills: Secondary | ICD-10-CM

## 2020-10-20 DIAGNOSIS — Z6831 Body mass index (BMI) 31.0-31.9, adult: Secondary | ICD-10-CM | POA: Diagnosis not present

## 2020-10-20 MED ORDER — NORETHINDRONE 0.35 MG PO TABS: 1.0000 | ORAL_TABLET | Freq: Every day | ORAL | 11 refills | Status: AC

## 2020-10-20 NOTE — Progress Notes (Signed)
GYNECOLOGY ANNUAL PREVENTATIVE CARE ENCOUNTER NOTE  Subjective:   Sara Jensen is a 25 y.o. G60P1001 female here for a routine annual gynecologic exam.  Current complaints: some tenderness at area of stitches from vaginal delivery.  Had postpartum exam 5 weeks ago.   Denies abnormal vaginal bleeding, discharge, pelvic pain, problems with intercourse or other gynecologic concerns.    Gynecologic History No LMP recorded. (Menstrual status: Lactating). Contraception: oral progesterone-only contraceptive Last Pap: Unknown.   Obstetric History OB History  Gravida Para Term Preterm AB Living  1 1 1     1   SAB IAB Ectopic Multiple Live Births        0 1    # Outcome Date GA Lbr Len/2nd Weight Sex Delivery Anes PTL Lv  1 Term 07/08/20 [redacted]w[redacted]d 18:55 / 03:23 7 lb 11.4 oz (3.498 kg) M Vag-Vacuum EPI  LIV    Past Medical History:  Diagnosis Date  . Anxiety   . Complication of anesthesia    woke up earlier than expected from anesthesia  . Depression   . Hypertension     Past Surgical History:  Procedure Laterality Date  . WISDOM TOOTH EXTRACTION      Current Outpatient Medications on File Prior to Visit  Medication Sig Dispense Refill  . Prenatal Vit-Fe Fumarate-FA (PRENATAL MULTIVITAMIN) TABS tablet Take 1 tablet by mouth at bedtime.    . norethindrone (MICRONOR) 0.35 MG tablet TAKE 1 TABLET (0.35 MG TOTAL) BY MOUTH DAILY. (Patient not taking: Reported on 10/20/2020) 28 tablet 0   No current facility-administered medications on file prior to visit.    Allergies  Allergen Reactions  . Shellfish Allergy Hives and Swelling    Social History   Socioeconomic History  . Marital status: Single    Spouse name: Not on file  . Number of children: Not on file  . Years of education: Not on file  . Highest education level: Not on file  Occupational History  . Not on file  Tobacco Use  . Smoking status: Never Smoker  . Smokeless tobacco: Never Used  Vaping Use  . Vaping Use:  Former  . Quit date: 10/30/2019  Substance and Sexual Activity  . Alcohol use: Not Currently    Comment: socially on weekends until pregnancy  . Drug use: Not Currently    Types: Marijuana    Comment: stopped when pregnant  . Sexual activity: Not Currently    Birth control/protection: Pill    Comment: missed a few pills ,then stopped planning to restart and got pregnant  Other Topics Concern  . Not on file  Social History Narrative  . Not on file   Social Determinants of Health   Financial Resource Strain: Not on file  Food Insecurity: No Food Insecurity  . Worried About 12/30/2019 in the Last Year: Never true  . Ran Out of Food in the Last Year: Never true  Transportation Needs: No Transportation Needs  . Lack of Transportation (Medical): No  . Lack of Transportation (Non-Medical): No  Physical Activity: Not on file  Stress: Not on file  Social Connections: Not on file  Intimate Partner Violence: Not on file    Family History  Problem Relation Age of Onset  . Hypertension Mother     The following portions of the patient's history were reviewed and updated as appropriate: allergies, current medications, past family history, past medical history, past social history, past surgical history and problem list.  Review of Systems  Pertinent items noted in HPI and remainder of comprehensive ROS otherwise negative.   Objective:  BP 116/80   Pulse 73   Ht 5\' 3"  (1.6 m)   Wt 178 lb 4.8 oz (80.9 kg)   BMI 31.58 kg/m  CONSTITUTIONAL: Well-developed, well-nourished female in no acute distress.  HENT:  Normocephalic, atraumatic, External right and left ear normal.  EYES: Conjunctivae and EOM are normal. Pupils are equal, round.  No scleral icterus.  NECK: Normal range of motion, supple, no masses.  Normal thyroid.  SKIN: Skin is warm and dry. No rash noted. Not diaphoretic. No erythema. No pallor. NEUROLOGIC: Alert and oriented to person, place, and time. Normal reflexes,  muscle tone coordination. No cranial nerve deficit noted. PSYCHIATRIC: Normal mood and affect. Normal behavior. Normal judgment and thought content. CARDIOVASCULAR: Normal heart rate noted, regular rhythm RESPIRATORY: Clear to auscultation bilaterally. Effort and breath sounds normal, no problems with respiration noted. BREASTS: deferred due to breastfeeding ABDOMEN: Soft, no distention noted.  No tenderness, rebound or guarding.  PELVIC: Normal appearing external genitalia with one 51mm papule at 6 o'clock that was slightly tender to palpation; normal appearing vaginal mucosa and cervix.  No abnormal discharge noted.  Pap smear obtained.  Normal uterine size, no other palpable masses, no uterine or adnexal tenderness. MUSCULOSKELETAL: Normal range of motion. No tenderness.  No cyanosis, clubbing, or edema.    Assessment and Plan:  1. Women's annual routine gynecological examination BP normal - has not had Norvasc in 2 weeks - no need to restart, but may need BP medication again with another pregnancy. 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 Advised [place] jelly for first intercourse. Nonsmoker  2. Cervical cancer screening Very nervous about speculum exam today as there is one area that had stitches and is still tender, but tolerated pap smear very well.  - Cytology - PAP( [place])  3. Oral contraceptive pill surveillance If stopping breastfeeding, will need OCP with estrogen also.  Call the office if stopping breastfeeding.  - norethindrone (ORTHO MICRONOR) 0.35 MG tablet; Take 1 tablet (0.35 mg total) by mouth daily.  Dispense: 30 tablet; Refill: 11  4. BMI 31.0-31.9,adult Weight loss advised to below BMI of 25 for your best health.  Will follow up results of pap smear and manage accordingly. Routine preventative health maintenance measures emphasized. Please refer to After Visit Summary for other counseling recommendations.    02-13-1968, RN, MSN,  NP-BC Nurse Practitioner, [facility] Center for [facility]

## 2020-10-21 LAB — CYTOLOGY - PAP

## 2020-10-23 ENCOUNTER — Encounter: Payer: Self-pay | Admitting: Nurse Practitioner

## 2020-10-23 DIAGNOSIS — R87612 Low grade squamous intraepithelial lesion on cytologic smear of cervix (LGSIL): Secondary | ICD-10-CM | POA: Insufficient documentation

## 2020-12-02 ENCOUNTER — Telehealth: Payer: Self-pay | Admitting: Family Medicine

## 2020-12-02 DIAGNOSIS — Z3041 Encounter for surveillance of contraceptive pills: Secondary | ICD-10-CM

## 2020-12-02 MED ORDER — NORGESTIMATE-ETH ESTRADIOL 0.25-35 MG-MCG PO TABS: 1.0000 | ORAL_TABLET | Freq: Every day | ORAL | 11 refills | Status: DC

## 2020-12-02 NOTE — Telephone Encounter (Signed)
Patient called into office requesting her birth control pills be changed as she is no longer breastfeeding. Sprintec sent to pharmacy per Dr Crissie Reese & patient was informed.

## 2021-01-26 IMAGING — US US OB < 14 WEEKS - US OB TV
1 series · 15 of 28 positions shown · non-contrast
Comparison: None.

CLINICAL DATA: Dating

EXAM:
OBSTETRIC <14 WK ULTRASOUND
TECHNIQUE: Transabdominal ultrasound was performed for evaluation of the
gestation as well as the maternal uterus and adnexal regions.

[Series 1: us ob < 14 weeks - us ob tv · 15 of 92 slices shown]
[im 1/92]
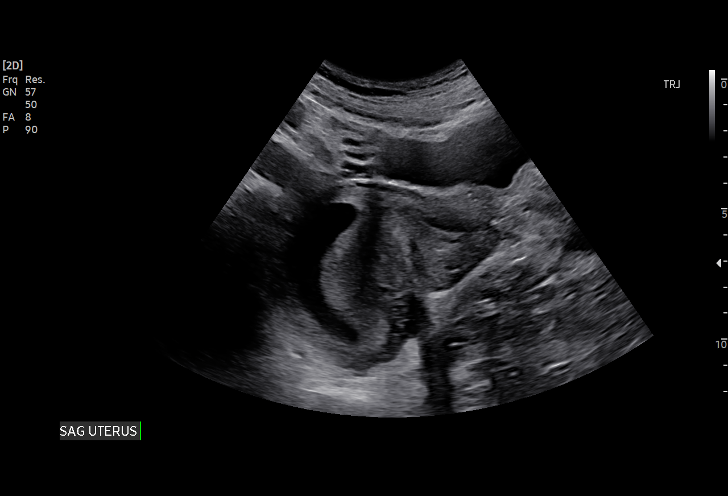
[im 7/92]
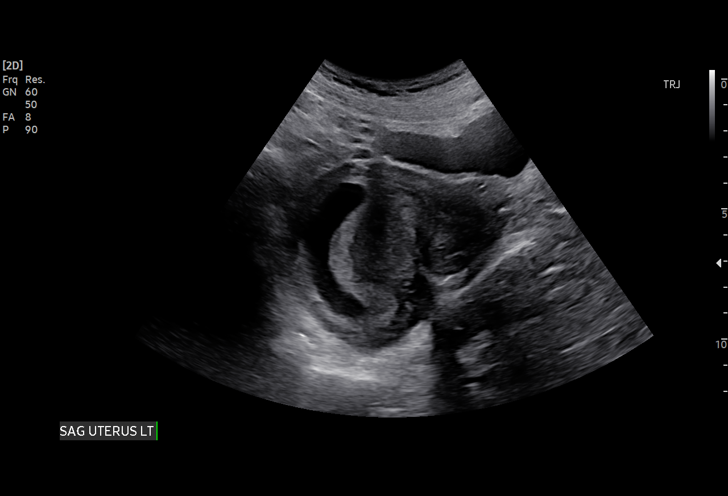
[im 14/92]
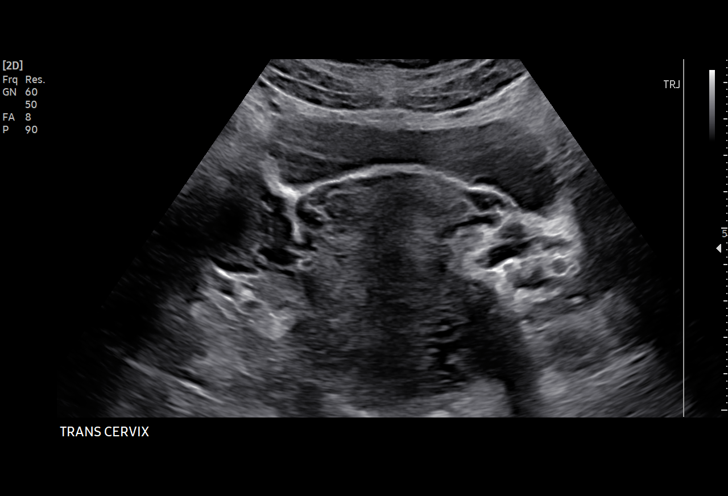
[im 21/92]
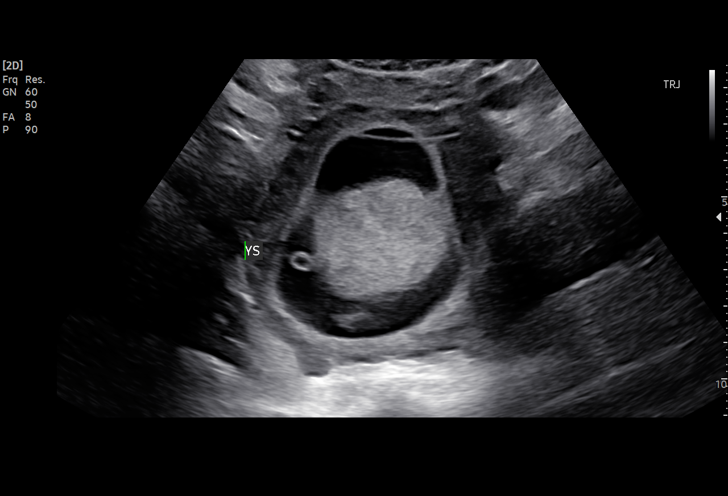
[im 27/92]
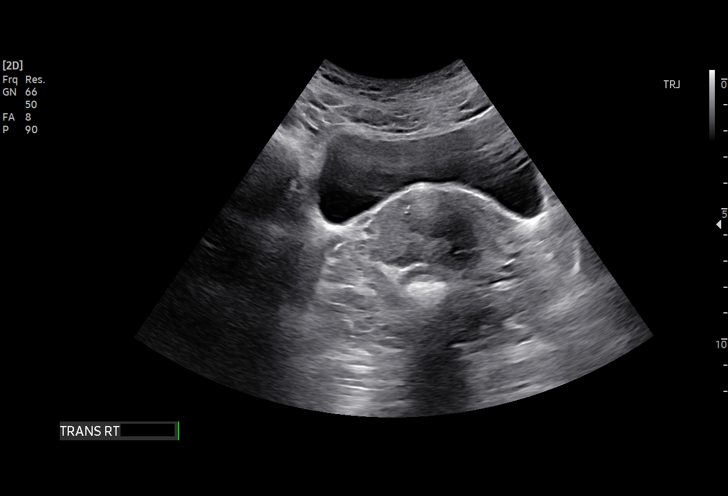
[im 34/92]
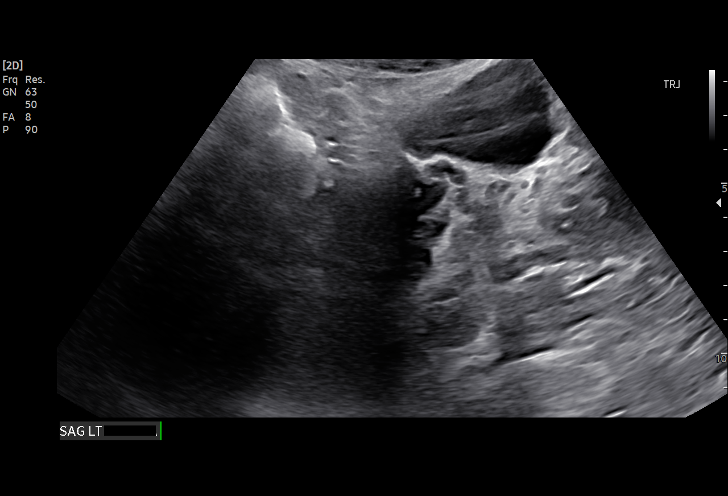
[im 41/92]
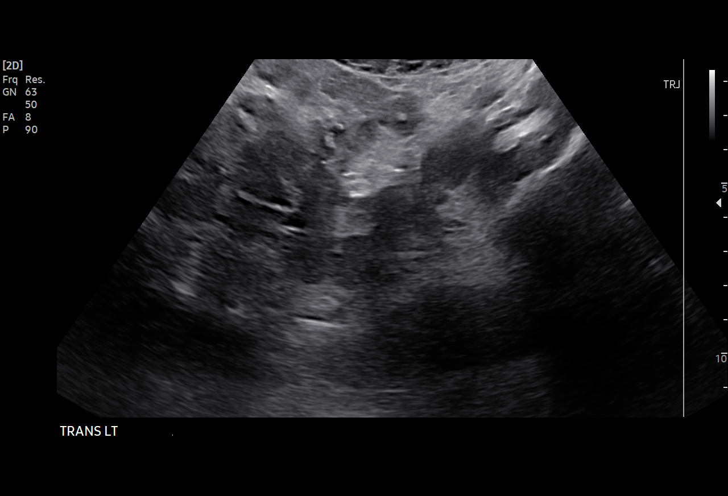
[im 48/92]
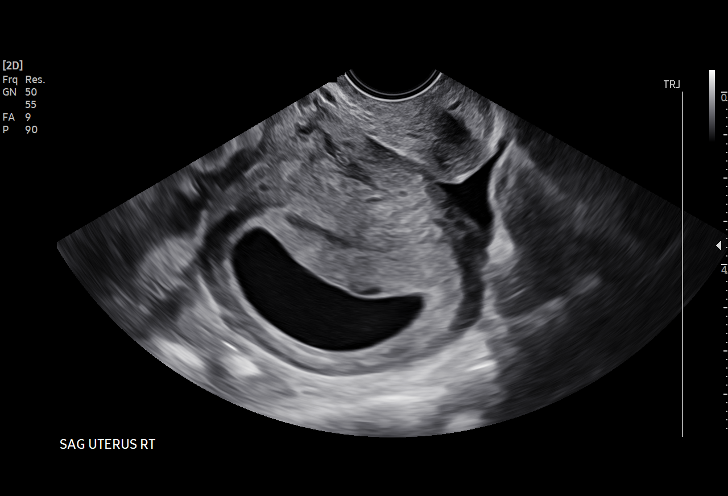
[im 51/92]
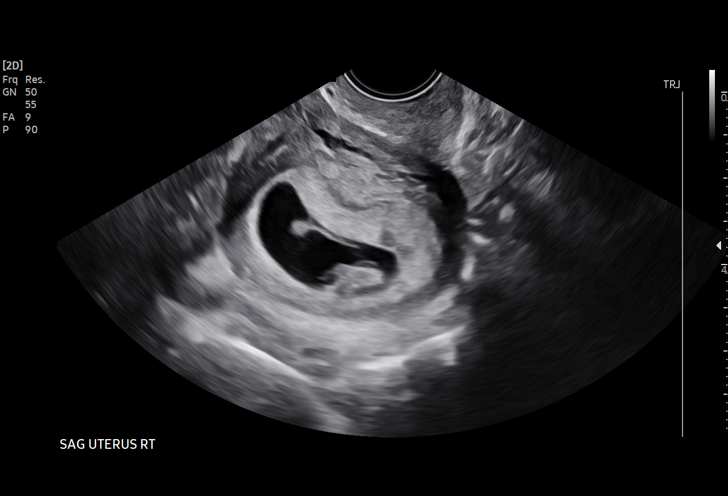
[im 58/92]
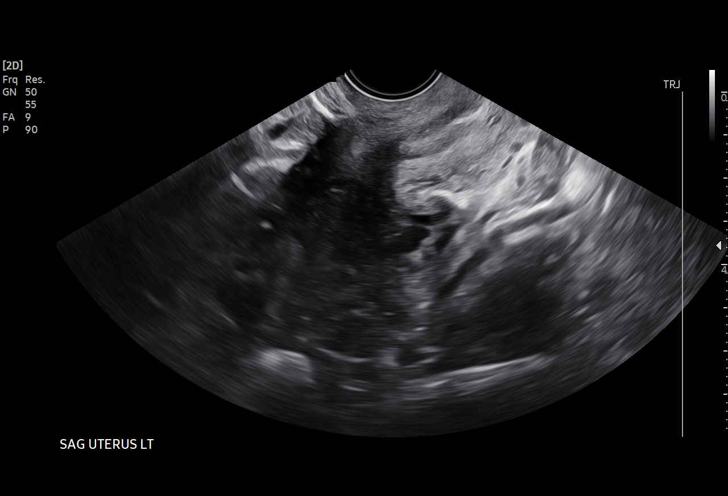
[im 65/92]
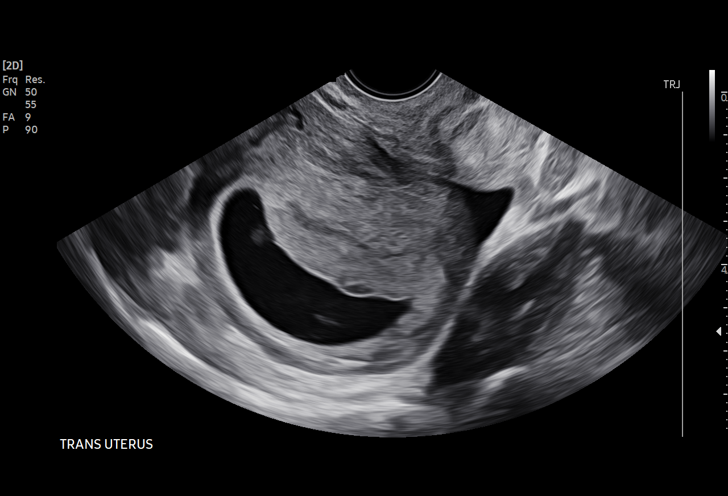
[im 71/92]
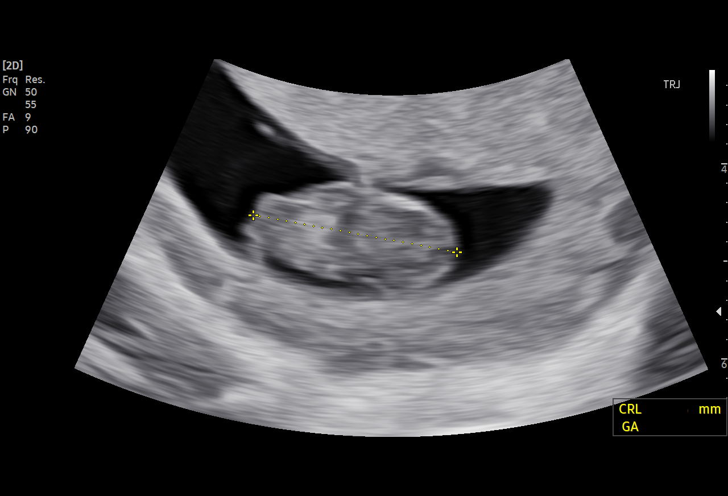
[im 78/92]
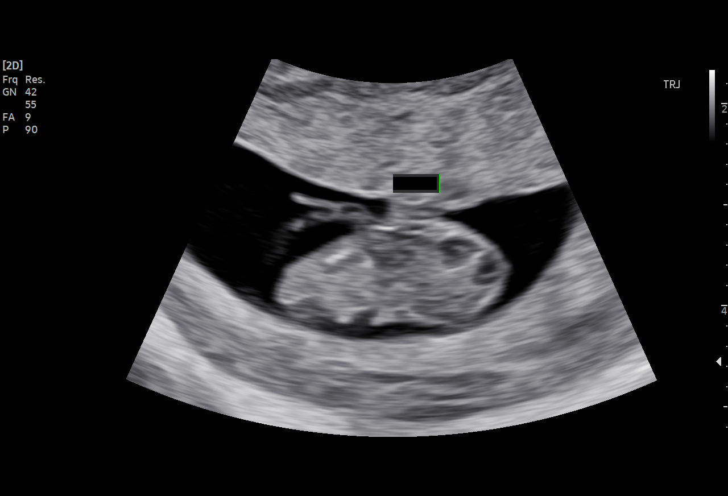
[im 85/92]
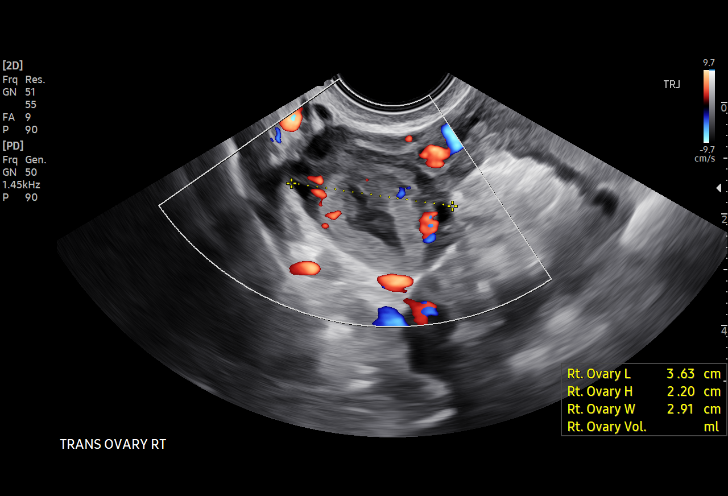
[im 92/92]
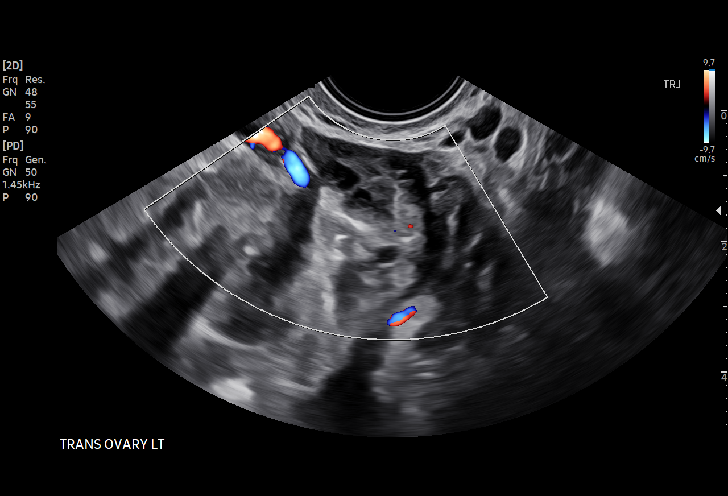

[15 of 28 positions shown; findings below may reference images not displayed]

FINDINGS: Intrauterine gestational sac: Single

Yolk sac:  Visualized.

Embryo:  Visualized.

Cardiac Activity: Visualized.

Heart Rate: 166 bpm

CRL:   21.9 mm   8 w 6 d                  US EDC: 07/10/2020

Subchorionic hemorrhage:  None visualized.

Maternal uterus/adnexae: Retroverted lie of the uterus.
IMPRESSION: Single intrauterine gestation at sonographic gestational age of 8
weeks, 6 days. EDD 07/10/2020. Fetal heart rate 166 bpm.

## 2021-08-19 IMAGING — US US MFM OB FOLLOW-UP
1 series · 14 of 25 positions shown · non-contrast
Comparison: none

[Series 1: us mfm ob follow-up · 25 acquisitions, 14 frames shown]
[im 1/25]
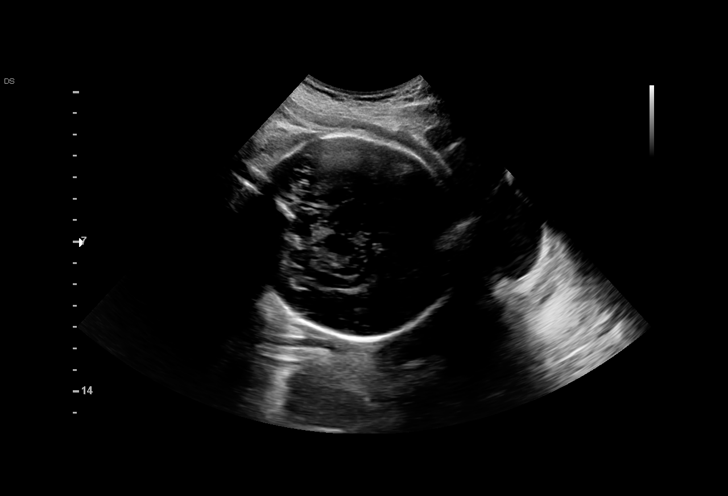
[im 3/25]
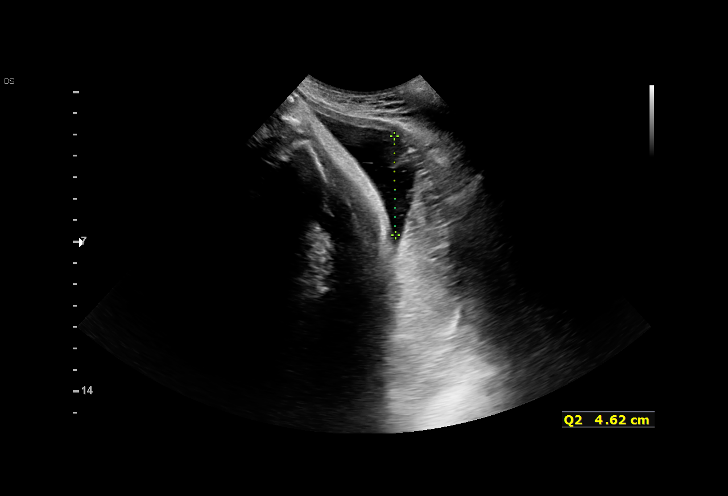
[im 5/25]
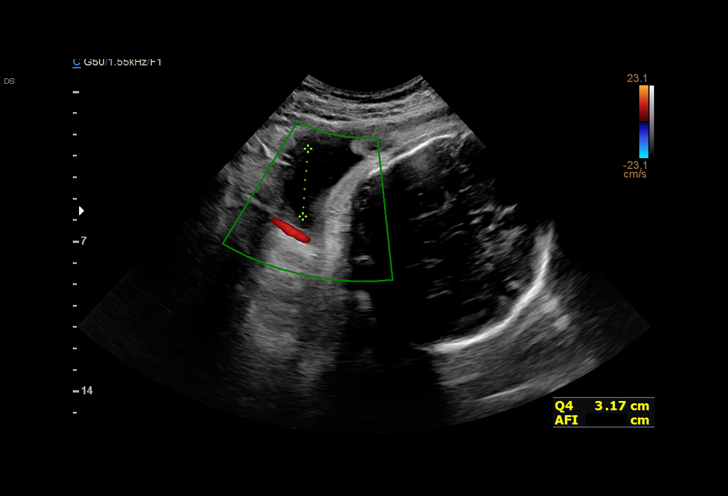
[im 7/25]
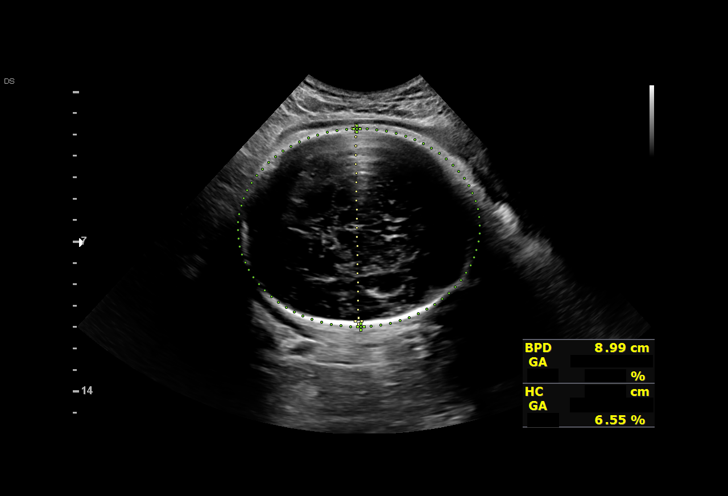
[im 9/25]
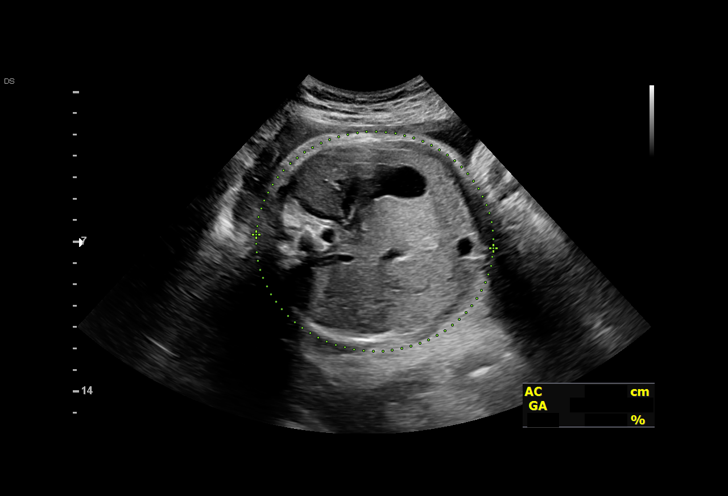
[im 10/25]
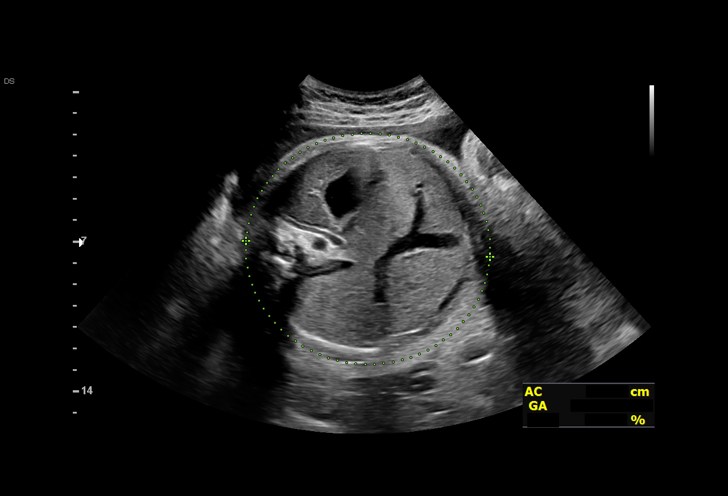
[im 12/25]
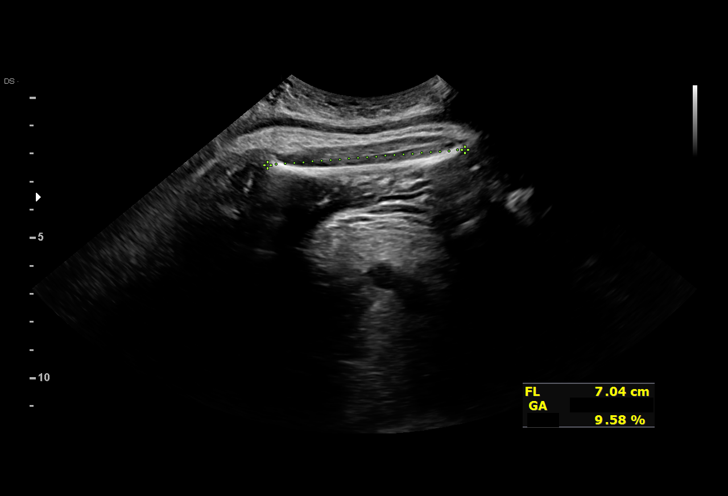
[im 14/25]
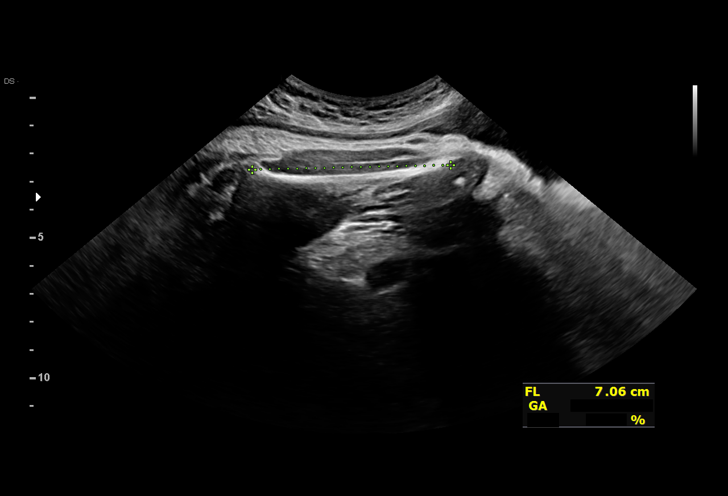
[im 16/25]
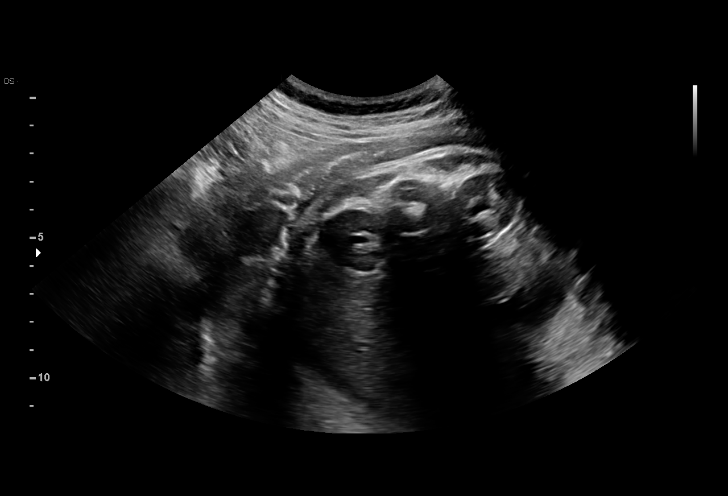
[im 17/25]
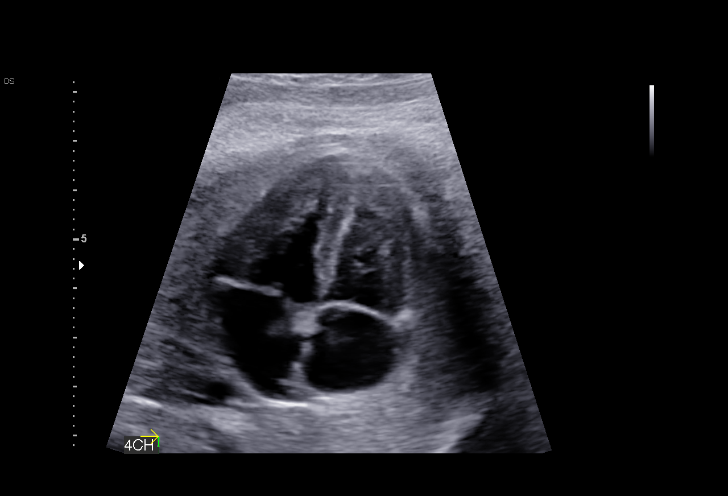
[im 19/25]
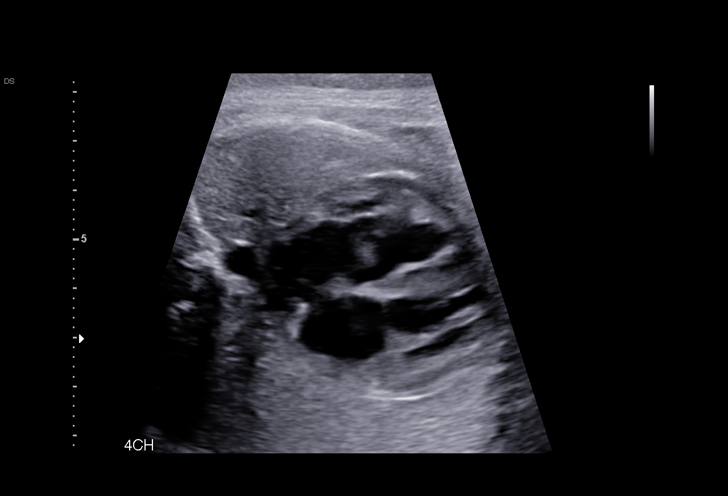
[im 21/25]
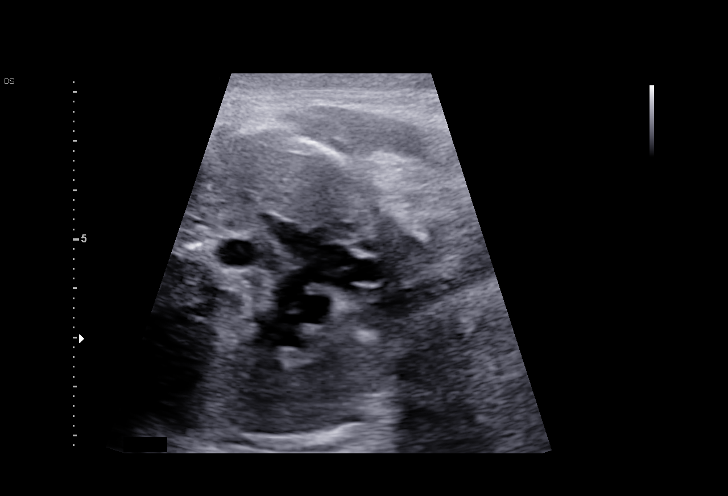
[im 23/25]
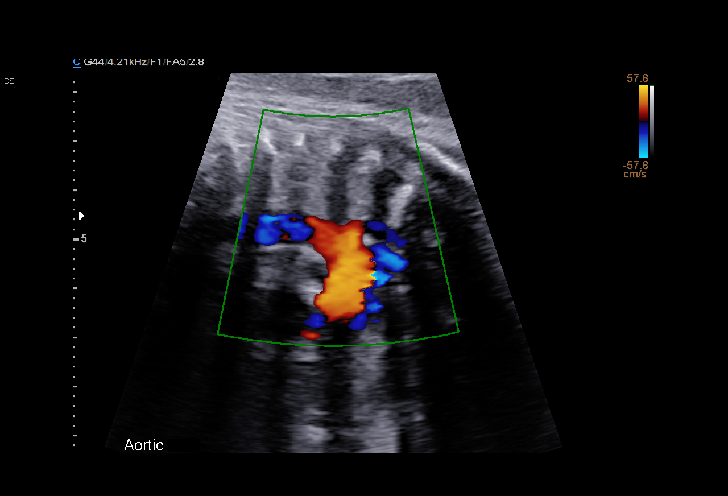
[im 25/25]
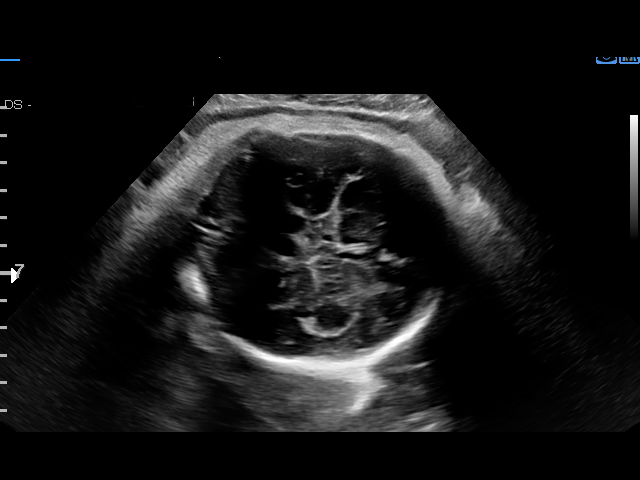

[14 of 25 positions shown; findings below may reference images not displayed]

Indications

 Encounter for antenatal screening for
 malformations (low risk NIPS)
 Genetic carrier (silent carrier for Walter Osvaldo Cris)PQL.R
 Echogenic intracardiac focus of the heart
 (EIF)
 38 weeks gestation of pregnancy
Vital Signs

                                                Height:        5'3"
Fetal Evaluation

 Num Of Fetuses:         1
 Cardiac Activity:       Observed
 Presentation:           Cephalic
 Placenta:               Fundal
 P. Cord Insertion:      Previously Visualized

 Amniotic Fluid
 AFI FV:      Within normal limits

 AFI Sum(cm)     %Tile       Largest Pocket(cm)
 10.97           33

               RLQ(cm)       LUQ(cm)        LLQ(cm)

Biometry

 BPD:      89.3  mm     G. Age:  36w 1d         21  %    CI:        76.03   %    70 - 86
                                                         FL/HC:      21.6   %    20.9 -
 HC:      324.6  mm     G. Age:  36w 5d          8  %    HC/AC:      0.94        0.92 -
 AC:       347   mm     G. Age:  38w 4d         80  %    FL/BPD:     78.5   %    71 - 87
 FL:       70.1  mm     G. Age:  36w 0d          8  %    FL/AC:      20.2   %    20 - 24
 LV:        3.8  mm

 Est. FW:    1225  gm      7 lb 2 oz     46  %
OB History

 Gravidity:    1
Gestational Age

 U/S Today:     36w 6d                                        EDD:   07/19/20
 Best:          38w 1d     Det. By:  Previous Ultrasound      EDD:   07/10/20
                                     (12/05/19)
Anatomy

 Cranium:               Appears normal         Heart:                  Appears normal;
                                                                       EIF NOT seen
 Cavum:                 Appears normal         RVOT:                   Appears normal
 Ventricles:            Appears normal         LVOT:                   Appears normal
 Choroid Plexus:        Appears normal         Aortic Arch:            Appears normal
 Cerebellum:            Appears normal         Ductal Arch:            Previously seen
 Posterior Fossa:       Appears normal         Stomach:                Appears normal, left
                                                                       sided
 Face:                  Appears normal         Kidneys:                Appear normal
                        (orbits and profile)
 Lips:                  Appears normal         Bladder:                Appears normal

 Other:  Male gender previously seen. Other anatomy previously imaged and
         appeared normal.
Impression

 Follow up growth due to size greater than dates.
 Normal interval growth with measurements consistent with
 dates
 Good fetal movement and amniotic fluid volume
 An EIF was previously visualized but it was not seen today.
Recommendations

 Follow up as clinically indicated.

## 2021-10-28 ENCOUNTER — Other Ambulatory Visit: Payer: Self-pay

## 2021-10-28 DIAGNOSIS — Z3041 Encounter for surveillance of contraceptive pills: Secondary | ICD-10-CM

## 2021-10-28 MED ORDER — NORGESTIMATE-ETH ESTRADIOL 0.25-35 MG-MCG PO TABS
1.0000 | ORAL_TABLET | Freq: Every day | ORAL | 11 refills | Status: AC
Start: 2021-10-28 — End: ?

## 2021-10-28 NOTE — Progress Notes (Signed)
Pt called requesting refill on BCP's Rx sent per protocol.

## 2021-12-10 ENCOUNTER — Ambulatory Visit: Payer: 59 | Admitting: Advanced Practice Midwife

## 2021-12-10 ENCOUNTER — Other Ambulatory Visit (HOSPITAL_COMMUNITY)
Admission: RE | Admit: 2021-12-10 | Discharge: 2021-12-10 | Disposition: A | Discharge status: Home / Self Care | Payer: 59 | Source: Ambulatory Visit | Attending: Advanced Practice Midwife | Admitting: Advanced Practice Midwife

## 2021-12-10 ENCOUNTER — Encounter: Payer: Self-pay | Admitting: Advanced Practice Midwife

## 2021-12-10 VITALS — BP 146/91 | HR 90 | Ht 63.0 in | Wt 168.0 lb

## 2021-12-10 DIAGNOSIS — N760 Acute vaginitis: Secondary | ICD-10-CM | POA: Insufficient documentation

## 2021-12-10 DIAGNOSIS — R87612 Low grade squamous intraepithelial lesion on cytologic smear of cervix (LGSIL): Secondary | ICD-10-CM | POA: Diagnosis present

## 2021-12-10 DIAGNOSIS — R030 Elevated blood-pressure reading, without diagnosis of hypertension: Secondary | ICD-10-CM

## 2021-12-10 DIAGNOSIS — Z01419 Encounter for gynecological examination (general) (routine) without abnormal findings: Secondary | ICD-10-CM | POA: Diagnosis not present

## 2021-12-10 DIAGNOSIS — G43709 Chronic migraine without aura, not intractable, without status migrainosus: Secondary | ICD-10-CM

## 2021-12-10 DIAGNOSIS — Z113 Encounter for screening for infections with a predominantly sexual mode of transmission: Secondary | ICD-10-CM | POA: Insufficient documentation

## 2021-12-10 DIAGNOSIS — R87610 Atypical squamous cells of undetermined significance on cytologic smear of cervix (ASC-US): Secondary | ICD-10-CM | POA: Insufficient documentation

## 2021-12-10 DIAGNOSIS — A5901 Trichomonal vulvovaginitis: Secondary | ICD-10-CM | POA: Insufficient documentation

## 2021-12-10 DIAGNOSIS — A599 Trichomoniasis, unspecified: Secondary | ICD-10-CM

## 2021-12-10 HISTORY — DX: Trichomonal vulvovaginitis: A59.01

## 2021-12-10 MED ORDER — HYDROXYZINE PAMOATE 25 MG PO CAPS: 25.0000 mg | ORAL_CAPSULE | Freq: Three times a day (TID) | ORAL | 0 refills | Status: DC | PRN

## 2021-12-10 NOTE — Progress Notes (Signed)
Patient presents for Annual Exam today.  LMP:3 wks ago.  Contraception: Pills pt wants to change birth control method considering IUD Mirena . STD Screening: Full Panel  Last pap: 10/20/2020 LSIL  Family Hx of Breast Cancer: None    CC: vaginal spotting onset Monday looks like old blood end of period.  *Pt recently lost grandmother

## 2021-12-10 NOTE — Patient Instructions (Addendum)
[email or web address]   [email or web address]

## 2021-12-11 NOTE — Progress Notes (Signed)
GYNECOLOGY ANNUAL PREVENTATIVE CARE ENCOUNTER NOTE  History:     Sara Jensen is a 26 y.o. G89P1001 female here for a routine annual gynecologic exam.  Current complaints: patient recently experienced a migraine which lasted four days, then resolved. She typically takes Excedrin for management. Denies abnormal vaginal bleeding, discharge, pelvic pain, problems with intercourse or other gynecologic concerns.   Patient is a on-smoker, works at [employer]. Lives with her son (born 07/2020). She requests STD screening but declines blood work today. She endorses recent unprotected sex with known partner.   Patient is considering switch to IUD for contraception but declines placement today   Gynecologic History No LMP recorded (approximate). Contraception: OCP (estrogen/progesterone) Last Pap: 09/2020. Result was abnormal (LSIL)   Obstetric History OB History  Gravida Para Term Preterm AB Living  1 1 1     1   SAB IAB Ectopic Multiple Live Births        0 1    # Outcome Date GA Lbr Len/2nd Weight Sex Delivery Anes PTL Lv  1 Term 07/08/20 [redacted]w[redacted]d 18:55 / 03:23 7 lb 11.4 oz (3.498 kg) M Vag-Vacuum EPI  LIV    Past Medical History:  Diagnosis Date   Anxiety    Complication of anesthesia    woke up earlier than expected from anesthesia   Depression    Hypertension     Past Surgical History:  Procedure Laterality Date   WISDOM TOOTH EXTRACTION      Current Outpatient Medications on File Prior to Visit  Medication Sig Dispense Refill   norgestimate-ethinyl estradiol (ORTHO-CYCLEN) 0.25-35 MG-MCG tablet Take 1 tablet by mouth daily. 28 tablet 11   norethindrone (MICRONOR) 0.35 MG tablet TAKE 1 TABLET (0.35 MG TOTAL) BY MOUTH DAILY. (Patient not taking: Reported on 10/20/2020) 28 tablet 0   norethindrone (ORTHO MICRONOR) 0.35 MG tablet Take 1 tablet (0.35 mg total) by mouth daily. 30 tablet 11   Prenatal Vit-Fe Fumarate-FA (PRENATAL MULTIVITAMIN) TABS tablet Take 1 tablet by mouth at  bedtime. (Patient not taking: Reported on 12/10/2021)     No current facility-administered medications on file prior to visit.    Allergies  Allergen Reactions   Shellfish Allergy Hives and Swelling    Social History:  reports that she has never smoked. She has never used smokeless tobacco. She reports current alcohol use. She reports that she does not currently use drugs after having used the following drugs: Marijuana.  Family History  Problem Relation Age of Onset   Hypertension Mother     The following portions of the patient's history were reviewed and updated as appropriate: allergies, current medications, past family history, past medical history, past social history, past surgical history and problem list.  Review of Systems Pertinent items noted in HPI and remainder of comprehensive ROS otherwise negative.  Physical Exam:  BP (!) 146/91   Pulse 90   Ht 5\' 3"  (1.6 m)   Wt 168 lb (76.2 kg)   LMP  (Approximate) Comment: 3wks ago  Breastfeeding No   BMI 29.76 kg/m  CONSTITUTIONAL: Well-developed, well-nourished female in no acute distress.  HENT:  Normocephalic, atraumatic, External right and left ear normal.  EYES: Conjunctivae and EOM are normal. Pupils are equal, round, and reactive to light. No scleral icterus.  NECK: Normal range of motion, supple, no masses.  Normal thyroid.  SKIN: Skin is warm and dry. No rash noted. Not diaphoretic. No erythema. No pallor. MUSCULOSKELETAL: Normal range of motion. No tenderness.  No cyanosis,  clubbing, or edema. NEUROLOGIC: Alert and oriented to person, place, and time. Normal reflexes, muscle tone coordination.  PSYCHIATRIC: Normal mood and affect. Normal behavior. Normal judgment and thought content. CARDIOVASCULAR: Normal heart rate noted, regular rhythm RESPIRATORY: Clear to auscultation bilaterally. Effort and breath sounds normal, no problems with respiration noted. BREASTS: Symmetric in size. No masses, tenderness, skin  changes, nipple drainage, or lymphadenopathy bilaterally. Performed in the presence of a chaperone. ABDOMEN: Soft, no distention noted.  No tenderness, rebound or guarding.  PELVIC: Normal appearing external genitalia and urethral meatus; normal appearing vaginal mucosa and cervix.  No abnormal vaginal discharge noted.  Pap smear obtained.  Performed in the presence of a chaperone.   Assessment and Plan:    1. Well woman exam with routine gynecological exam - Needs PCP: Ambulatory referral to [facility] - Used bedside.org and CDC data to specifically review IUD options based on patient stated interest. Risks and benefits reviewed.  Questions were answered.  Information was given to patient to review.  - Patient declines offer of placement today  2. LGSIL on Pap smear of cervix  - Cytology - PAP  3. Chronic migraine without aura without status migrainosus, not intractable  - AMB referral to headache clinic  4. Screening for STD (sexually transmitted disease)  - Cervicovaginal ancillary only( [facility])  5. Elevated BP without diagnosis of Hypertension - Hx Gestational Hypertension - Improved but still mildly elevated when rechecked at end of visit - Encouraged patient to schedule care with PCP, can follow up with either MD in office or new PCP  Will follow up results of pap smear and manage accordingly.  Clayton Bibles, MSA, MSN, CNM Certified Nurse Midwife, Biochemist, clinical for [employer], [facility]

## 2021-12-14 LAB — CERVICOVAGINAL ANCILLARY ONLY
Bacterial Vaginitis (gardnerella): POSITIVE — AB
Candida Glabrata: NEGATIVE
Candida Vaginitis: NEGATIVE
Chlamydia: NEGATIVE
Comment: NEGATIVE
Comment: NEGATIVE
Comment: NEGATIVE
Comment: NEGATIVE
Comment: NEGATIVE
Comment: NORMAL
Neisseria Gonorrhea: NEGATIVE
Trichomonas: POSITIVE — AB

## 2021-12-14 MED ORDER — METRONIDAZOLE 500 MG PO TABS: 500.0000 mg | ORAL_TABLET | Freq: Two times a day (BID) | ORAL | 0 refills | Status: AC

## 2021-12-14 NOTE — Addendum Note (Signed)
Addended by: Clayton Bibles C on: 12/14/2021 11:13 PM   Modules accepted: Orders

## 2021-12-16 LAB — CYTOLOGY - PAP
Comment: NEGATIVE
Diagnosis: UNDETERMINED — AB
High risk HPV: POSITIVE — AB

## 2021-12-17 ENCOUNTER — Telehealth: Payer: Self-pay | Admitting: *Deleted

## 2021-12-17 ENCOUNTER — Encounter: Payer: Self-pay | Admitting: Advanced Practice Midwife

## 2021-12-17 NOTE — Telephone Encounter (Signed)
-----   Message from Sara Jensen, [place] sent at 12/16/2021  2:12 PM EDT ----- Abnormal pap, needs Colpo scheduled please. I notified patient via MyChart message a moment ago. SW

## 2021-12-17 NOTE — Telephone Encounter (Signed)
Called pt to make sure she was aware of results and answer any questions. Colpo scheduled for 02/09/22 with Dr Mervyn Skeeters

## 2021-12-18 ENCOUNTER — Encounter: Payer: Self-pay | Admitting: Advanced Practice Midwife

## 2021-12-18 DIAGNOSIS — R87610 Atypical squamous cells of undetermined significance on cytologic smear of cervix (ASC-US): Secondary | ICD-10-CM | POA: Insufficient documentation

## 2022-01-01 ENCOUNTER — Encounter: Payer: Self-pay | Admitting: Physician Assistant

## 2022-01-01 ENCOUNTER — Ambulatory Visit: Payer: 59 | Admitting: Physician Assistant

## 2022-01-01 VITALS — BP 141/94 | HR 80 | Wt 169.0 lb

## 2022-01-01 DIAGNOSIS — R030 Elevated blood-pressure reading, without diagnosis of hypertension: Secondary | ICD-10-CM

## 2022-01-01 DIAGNOSIS — G43009 Migraine without aura, not intractable, without status migrainosus: Secondary | ICD-10-CM | POA: Diagnosis not present

## 2022-01-01 MED ORDER — CYCLOBENZAPRINE HCL 10 MG PO TABS: 10.0000 mg | ORAL_TABLET | Freq: Three times a day (TID) | ORAL | 2 refills | Status: AC | PRN

## 2022-01-01 MED ORDER — METOPROLOL TARTRATE 25 MG PO TABS: 25.0000 mg | ORAL_TABLET | Freq: Every day | ORAL | 3 refills | Status: AC

## 2022-01-01 MED ORDER — UBRELVY 100 MG PO TABS: 100.0000 mg | ORAL_TABLET | ORAL | 3 refills | Status: AC | PRN

## 2022-01-01 NOTE — Patient Instructions (Signed)
Migraine Headache ?A migraine headache is a very strong throbbing pain on one side or both sides of your head. This type of headache can also cause other symptoms. It can last from 4 hours to 3 days. Talk with your doctor about what things may bring on (trigger) this condition. ?What are the causes? ?The exact cause of this condition is not known. This condition may be triggered or caused by: ?Drinking alcohol. ?Smoking. ?Taking medicines, such as: ?Medicine used to treat chest pain (nitroglycerin). ?Birth control pills. ?Estrogen. ?Some blood pressure medicines. ?Eating or drinking certain products. ?Doing physical activity. ?Other things that may trigger a migraine headache include: ?Having a menstrual period. ?Pregnancy. ?Hunger. ?Stress. ?Not getting enough sleep or getting too much sleep. ?Weather changes. ?Tiredness (fatigue). ?What increases the risk? ?Being 25-55 years old. ?Being female. ?Having a family history of migraine headaches. ?Being Caucasian. ?Having depression or anxiety. ?Being very overweight. ?What are the signs or symptoms? ?A throbbing pain. This pain may: ?Happen in any area of the head, such as on one side or both sides. ?Make it hard to do daily activities. ?Get worse with physical activity. ?Get worse around bright lights or loud noises. ?Other symptoms may include: ?Feeling sick to your stomach (nauseous). ?Vomiting. ?Dizziness. ?Being sensitive to bright lights, loud noises, or smells. ?Before you get a migraine headache, you may get warning signs (an aura). An aura may include: ?Seeing flashing lights or having blind spots. ?Seeing bright spots, halos, or zigzag lines. ?Having tunnel vision or blurred vision. ?Having numbness or a tingling feeling. ?Having trouble talking. ?Having weak muscles. ?Some people have symptoms after a migraine headache (postdromal phase), such as: ?Tiredness. ?Trouble thinking (concentrating). ?How is this treated? ?Taking medicines that: ?Relieve  pain. ?Relieve the feeling of being sick to your stomach. ?Prevent migraine headaches. ?Treatment may also include: ?Having acupuncture. ?Avoiding foods that bring on migraine headaches. ?Learning ways to control your body functions (biofeedback). ?Therapy to help you know and deal with negative thoughts (cognitive behavioral therapy). ?Follow these instructions at home: ?Medicines ?Take over-the-counter and prescription medicines only as told by your doctor. ?Ask your doctor if the medicine prescribed to you: ?Requires you to avoid driving or using heavy machinery. ?Can cause trouble pooping (constipation). You may need to take these steps to prevent or treat trouble pooping: ?Drink enough fluid to keep your pee (urine) pale yellow. ?Take over-the-counter or prescription medicines. ?Eat foods that are high in fiber. These include beans, whole grains, and fresh fruits and vegetables. ?Limit foods that are high in fat and sugar. These include fried or sweet foods. ?Lifestyle ?Do not drink alcohol. ?Do not use any products that contain nicotine or tobacco, such as cigarettes, e-cigarettes, and chewing tobacco. If you need help quitting, ask your doctor. ?Get at least 8 hours of sleep every night. ?Limit and deal with stress. ?General instructions ? ?  ? ?Keep a journal to find out what may bring on your migraine headaches. For example, write down: ?What you eat and drink. ?How much sleep you get. ?Any change in what you eat or drink. ?Any change in your medicines. ?If you have a migraine headache: ?Avoid things that make your symptoms worse, such as bright lights. ?It may help to lie down in a dark, quiet room. ?Do not drive or use heavy machinery. ?Ask your doctor what activities are safe for you. ?Keep all follow-up visits as told by your doctor. This is important. ?Contact a doctor if: ?You get a migraine   headache that is different or worse than others you have had. ?You have more than 15 headache days in one  month. ?Get help right away if: ?Your migraine headache gets very bad. ?Your migraine headache lasts longer than 72 hours. ?You have a fever. ?You have a stiff neck. ?You have trouble seeing. ?Your muscles feel weak or like you cannot control them. ?You start to lose your balance a lot. ?You start to have trouble walking. ?You pass out (faint). ?You have a seizure. ?Summary ?A migraine headache is a very strong throbbing pain on one side or both sides of your head. These headaches can also cause other symptoms. ?This condition may be treated with medicines and changes to your lifestyle. ?Keep a journal to find out what may bring on your migraine headaches. ?Contact a doctor if you get a migraine headache that is different or worse than others you have had. ?Contact your doctor if you have more than 15 headache days in a month. ?This information is not intended to replace advice given to you by your health care provider. Make sure you discuss any questions you have with your health care provider. ?Document Revised: 09/08/2018 Document Reviewed: 06/29/2018 ?Elsevier Patient Education ? 2023 Elsevier Inc. ? ?

## 2022-01-01 NOTE — Progress Notes (Signed)
Patient here for management of HA.  Notes they come and go can last a few days, had a episode where she was not able to get out of bed. Had nausea and vomiting.  Pt has been dealing w/ HA's for years. Notes HA's became worse with Dx of Anxiety.  Denies any visual changes.   History:  Sara Jensen is a 26 y.o. G1P1001 who presents to clinic today for headache.  Headaches are severe, throbbing, worse with movement, lights and noise cause a problem, often nausea, occasionally vomiting.  It will last all day or more.  She may go to bed with a small headache and wake up with it severe. Triggers include stress.  She has tried excedrin, tylenol, ibuprofen.   High blood pressure started in pregnancy.   Number of days in the last 4 weeks with:  Severe headache: 2 Moderate headache: 6 Mild headache: 0  No headache: 20   Past Medical History:  Diagnosis Date   Anxiety    Complication of anesthesia    woke up earlier than expected from anesthesia   Depression    Hypertension     Social History   Socioeconomic History   Marital status: Single    Spouse name: Not on file   Number of children: Not on file   Years of education: Not on file   Highest education level: Not on file  Occupational History   Not on file  Tobacco Use   Smoking status: Never   Smokeless tobacco: Never  Vaping Use   Vaping Use: Some days   Last attempt to quit: 10/30/2019  Substance and Sexual Activity   Alcohol use: Yes    Comment: socially   Drug use: Not Currently    Types: Marijuana    Comment: stopped when pregnant   Sexual activity: Not Currently    Birth control/protection: Pill    Comment: missed a few pills ,then stopped planning to restart and got pregnant  Other Topics Concern   Not on file  Social History Narrative   Not on file   Social Determinants of Health   Financial Resource Strain: Not on file  Food Insecurity: No Food Insecurity (06/11/2020)   Hunger Vital Sign    Worried About  Running Out of Food in the Last Year: Never true    Ran Out of Food in the Last Year: Never true  Transportation Needs: No Transportation Needs (06/11/2020)   PRAPARE - Administrator, Civil Service (Medical): No    Lack of Transportation (Non-Medical): No  Physical Activity: Not on file  Stress: Not on file  Social Connections: Not on file  Intimate Partner Violence: Not on file    Family History  Problem Relation Age of Onset   Cancer Paternal Grandmother    Cancer Maternal Grandmother    Hypertension Mother     Allergies  Allergen Reactions   Shellfish Allergy Hives and Swelling    Current Outpatient Medications on File Prior to Visit  Medication Sig Dispense Refill   hydrOXYzine (VISTARIL) 25 MG capsule Take 1 capsule (25 mg total) by mouth 3 (three) times daily as needed. 30 capsule 0   norgestimate-ethinyl estradiol (ORTHO-CYCLEN) 0.25-35 MG-MCG tablet Take 1 tablet by mouth daily. 28 tablet 11   metroNIDAZOLE (FLAGYL) 500 MG tablet Take 1 tablet (500 mg total) by mouth 2 (two) times daily. (Patient not taking: Reported on 01/01/2022) 14 tablet 0   norethindrone (MICRONOR) 0.35 MG tablet TAKE 1 TABLET (  0.35 MG TOTAL) BY MOUTH DAILY. (Patient not taking: Reported on 10/20/2020) 28 tablet 0   norethindrone (ORTHO MICRONOR) 0.35 MG tablet Take 1 tablet (0.35 mg total) by mouth daily. 30 tablet 11   Prenatal Vit-Fe Fumarate-FA (PRENATAL MULTIVITAMIN) TABS tablet Take 1 tablet by mouth at bedtime. (Patient not taking: Reported on 12/10/2021)     No current facility-administered medications on file prior to visit.     Review of Systems:  All pertinent positive/negative included in HPI, all other review of systems are negative   Objective:  Physical Exam BP (!) 141/94   Pulse 80   Wt 169 lb (76.7 kg)   LMP 12/16/2021 (Approximate)   Breastfeeding No   BMI 29.94 kg/m  CONSTITUTIONAL: Well-developed, well-nourished female in no acute distress.  EYES: EOM  intact ENT: Normocephalic CARDIOVASCULAR: Regular rate and rhythm with no adventitious sounds.  RESPIRATORY: Normal rate.  MUSCULOSKELETAL: Normal ROM SKIN: Warm, dry without erythema  NEUROLOGICAL: Alert, oriented, CN II-XII grossly intact, Appropriate balance PSYCH: Normal behavior, mood   Assessment & Plan:   1. Migraine without aura and without status migrainosus, not intractable   2. Elevated blood pressure reading without diagnosis of hypertension      Plan: Using Ubrelvy instead of triptan due to elevated blood pressure.  Bernita Raisin asap for acute migraine relief. Cyclobenzaprine to help with headache before it becomes severe and with muscle spasm.  Sedation precautions discussed.  Metoprolol for migraine prevention - may break tab in half initially but I suspect she will both tolerate and need the full 25mg  daily.   Follow-up in 3 months or sooner PRN  Miyanna, [place]iers[place], PA-C 01/01/2022 8:57 AM

## 2022-02-09 ENCOUNTER — Telehealth: Payer: Self-pay

## 2022-02-09 ENCOUNTER — Encounter: Payer: 59 | Admitting: Obstetrics & Gynecology

## 2022-02-09 NOTE — Telephone Encounter (Signed)
Left message for pt to call the office back regarding rescheduling appointment for today.

## 2022-03-30 ENCOUNTER — Encounter: Payer: Self-pay | Admitting: Obstetrics & Gynecology

## 2022-03-30 ENCOUNTER — Other Ambulatory Visit (HOSPITAL_COMMUNITY)
Admission: RE | Admit: 2022-03-30 | Discharge: 2022-03-30 | Disposition: A | Discharge status: Home / Self Care | Payer: 59 | Source: Ambulatory Visit | Attending: Obstetrics & Gynecology | Admitting: Obstetrics & Gynecology

## 2022-03-30 ENCOUNTER — Ambulatory Visit (INDEPENDENT_AMBULATORY_CARE_PROVIDER_SITE_OTHER): Payer: 59 | Admitting: Obstetrics & Gynecology

## 2022-03-30 VITALS — BP 152/96 | HR 71 | Wt 178.0 lb

## 2022-03-30 DIAGNOSIS — R87810 Cervical high risk human papillomavirus (HPV) DNA test positive: Secondary | ICD-10-CM | POA: Diagnosis not present

## 2022-03-30 DIAGNOSIS — A599 Trichomoniasis, unspecified: Secondary | ICD-10-CM | POA: Diagnosis present

## 2022-03-30 DIAGNOSIS — R87610 Atypical squamous cells of undetermined significance on cytologic smear of cervix (ASC-US): Secondary | ICD-10-CM | POA: Diagnosis present

## 2022-03-30 DIAGNOSIS — Z113 Encounter for screening for infections with a predominantly sexual mode of transmission: Secondary | ICD-10-CM

## 2022-03-30 DIAGNOSIS — A749 Chlamydial infection, unspecified: Secondary | ICD-10-CM

## 2022-03-30 NOTE — Progress Notes (Signed)
    GYNECOLOGY OFFICE COLPOSCOPY PROCEDURE NOTE  26 y.o. G1P1001 here for colposcopy for ASCUS with POSITIVE high risk HPV pap smear on 12/10/2021. Discussed role for HPV in cervical dysplasia, need for surveillance.  Patient was also positive for trichomonal vaginitis on 12/10/2021. She and her partner have been treated. She desires STI screen today.  Patient gave informed written consent, time out was performed.  Placed in lithotomy position. Vaginal swab specimen obtained for testing obtained. Cervix viewed with speculum and colposcope after application of acetic acid.   Colposcopy adequate? Yes Acetowhite lesion(s) noted at 12 and 5 o'clock; corresponding biopsies obtained.  ECC specimen obtained. All specimens were labeled and sent to pathology.  Chaperone was present during entire procedure.  Patient was given post procedure instructions.  Will follow up pathology and tests (both vaginal specimen and serum testing done today) and manage accordingly; patient will be contacted with results and recommendations.  Routine preventative health maintenance measures emphasized.    Verita Schneiders, MD, [place] for [employer], [place]

## 2022-03-30 NOTE — Addendum Note (Signed)
Addended by: Verita Schneiders A on: 03/30/2022 02:16 PM   Modules accepted: Level of Service

## 2022-03-31 ENCOUNTER — Encounter: Payer: Self-pay | Admitting: Obstetrics & Gynecology

## 2022-03-31 DIAGNOSIS — N870 Mild cervical dysplasia: Secondary | ICD-10-CM | POA: Insufficient documentation

## 2022-03-31 LAB — CERVICOVAGINAL ANCILLARY ONLY
Chlamydia: POSITIVE — AB
Comment: NEGATIVE
Comment: NEGATIVE
Comment: NORMAL
Neisseria Gonorrhea: NEGATIVE
Trichomonas: NEGATIVE

## 2022-03-31 LAB — RPR+HBSAG+HCVAB+...
HIV Screen 4th Generation wRfx: NONREACTIVE
Hep C Virus Ab: NONREACTIVE
Hepatitis B Surface Ag: NEGATIVE
RPR Ser Ql: NONREACTIVE

## 2022-03-31 LAB — SURGICAL PATHOLOGY

## 2022-04-01 ENCOUNTER — Encounter: Payer: Self-pay | Admitting: Obstetrics & Gynecology

## 2022-04-01 DIAGNOSIS — A749 Chlamydial infection, unspecified: Secondary | ICD-10-CM | POA: Insufficient documentation

## 2022-04-01 MED ORDER — DOXYCYCLINE HYCLATE 100 MG PO CAPS: 100.0000 mg | ORAL_CAPSULE | Freq: Two times a day (BID) | ORAL | 1 refills | Status: AC

## 2022-04-01 NOTE — Progress Notes (Signed)
Patient has chlamydia.  Negative testing for other STIs. Recommend that needs to let partner(s) know so the partner(s) can get testing and treatment. Patient and sex partner(s) should abstain from unprotected sexual activity for seven days after everyone receives appropriate treatment.  Doxycycline was prescribed for patient.  Patient will need to return in about 4 weeks after treatment for repeat test of cure.  Please call to inform patient of results and recommendations, and advise to pick up prescription and take as directed.  Please advise patient to practice safe sex at all times.  Verita Schneiders, MD

## 2022-04-01 NOTE — Addendum Note (Signed)
Addended by: Verita Schneiders A on: 04/01/2022 08:46 AM   Modules accepted: Orders

## 2022-04-02 ENCOUNTER — Other Ambulatory Visit: Payer: Self-pay

## 2022-04-02 ENCOUNTER — Telehealth: Payer: Self-pay | Admitting: Obstetrics and Gynecology

## 2022-04-02 DIAGNOSIS — A749 Chlamydial infection, unspecified: Secondary | ICD-10-CM

## 2022-04-02 MED ORDER — AZITHROMYCIN 500 MG PO TABS: 1000.0000 mg | ORAL_TABLET | Freq: Once | ORAL | Status: AC

## 2022-04-02 MED ORDER — AZITHROMYCIN 500 MG PO TABS: 1000.0000 mg | ORAL_TABLET | Freq: Once | ORAL | 0 refills | Status: AC

## 2022-04-02 MED ORDER — ONDANSETRON 4 MG PO TBDP: 4.0000 mg | ORAL_TABLET | Freq: Four times a day (QID) | ORAL | 0 refills | Status: AC | PRN

## 2022-04-02 NOTE — Telephone Encounter (Signed)
Call from nurse line, pt didn't get azithromycin, I sent it in.

## 2022-04-02 NOTE — Progress Notes (Signed)
Ok to change treatment medication for +CT per Dr.A and send in Zofran. Pt agreeable and voiced understanding.

## 2022-05-27 ENCOUNTER — Other Ambulatory Visit: Payer: Self-pay | Admitting: Advanced Practice Midwife

## 2022-05-27 MED ORDER — HYDROXYZINE PAMOATE 25 MG PO CAPS: 25.0000 mg | ORAL_CAPSULE | Freq: Three times a day (TID) | ORAL | 0 refills | Status: AC | PRN

## 2022-07-16 ENCOUNTER — Telehealth: Payer: Self-pay | Admitting: *Deleted

## 2022-07-16 NOTE — Telephone Encounter (Signed)
Outcome Approved on February 12 PA Case: [id number], Status: Approved, Coverage Starts on: 07/05/2022 12:00 AM, Coverage Ends on: 07/06/2023 12:00 AM. Questions? Contact [phone]. Authorization Expiration Date: 07/05/2023

## 2022-09-29 ENCOUNTER — Other Ambulatory Visit: Payer: Self-pay

## 2022-12-23 ENCOUNTER — Encounter: Payer: Self-pay | Admitting: *Deleted

## 2023-01-13 ENCOUNTER — Other Ambulatory Visit: Payer: Self-pay | Admitting: Physician Assistant

## 2023-01-18 ENCOUNTER — Other Ambulatory Visit: Payer: Self-pay | Admitting: Physician Assistant

## 2023-02-02 ENCOUNTER — Ambulatory Visit: Payer: 59 | Admitting: Nurse Practitioner
